# Patient Record
Sex: Male | Born: 1965
Health system: Southern US, Community
[De-identification: ages and names within clinical notes are randomized; demographics above are authoritative.]

## PROBLEM LIST (undated history)

## (undated) DIAGNOSIS — K219 Gastro-esophageal reflux disease without esophagitis: Secondary | ICD-10-CM

## (undated) DIAGNOSIS — E785 Hyperlipidemia, unspecified: Secondary | ICD-10-CM

## (undated) DIAGNOSIS — J302 Other seasonal allergic rhinitis: Secondary | ICD-10-CM

## (undated) DIAGNOSIS — M2550 Pain in unspecified joint: Secondary | ICD-10-CM

## (undated) DIAGNOSIS — M549 Dorsalgia, unspecified: Secondary | ICD-10-CM

## (undated) DIAGNOSIS — G4733 Obstructive sleep apnea (adult) (pediatric): Principal | ICD-10-CM

## (undated) DIAGNOSIS — G2581 Restless legs syndrome: Secondary | ICD-10-CM

## (undated) DIAGNOSIS — G4730 Sleep apnea, unspecified: Secondary | ICD-10-CM

## (undated) DIAGNOSIS — Z8619 Personal history of other infectious and parasitic diseases: Secondary | ICD-10-CM

## (undated) HISTORY — DX: Gastro-esophageal reflux disease without esophagitis: K21.9

## (undated) HISTORY — DX: Hyperlipidemia, unspecified: E78.5

## (undated) HISTORY — DX: Other seasonal allergic rhinitis: J30.2

## (undated) HISTORY — DX: Personal history of other infectious and parasitic diseases: Z86.19

## (undated) HISTORY — DX: Dorsalgia, unspecified: M54.9

## (undated) HISTORY — PX: ANKLE SURGERY: SHX546

## (undated) HISTORY — DX: Restless legs syndrome: G25.81

## (undated) HISTORY — DX: Sleep apnea, unspecified: G47.30

## (undated) HISTORY — DX: Pain in unspecified joint: M25.50

## (undated) HISTORY — PX: UMBILICAL HERNIA REPAIR: SHX196

## (undated) HISTORY — DX: Obstructive sleep apnea (adult) (pediatric): G47.33

---
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
## 2008-03-28 ENCOUNTER — Ambulatory Visit: Payer: Self-pay | Admitting: Family Medicine

## 2008-03-28 DIAGNOSIS — Z8619 Personal history of other infectious and parasitic diseases: Secondary | ICD-10-CM | POA: Insufficient documentation

## 2008-03-28 DIAGNOSIS — E1169 Type 2 diabetes mellitus with other specified complication: Secondary | ICD-10-CM | POA: Insufficient documentation

## 2008-03-28 DIAGNOSIS — E785 Hyperlipidemia, unspecified: Secondary | ICD-10-CM | POA: Insufficient documentation

## 2008-03-28 DIAGNOSIS — F17200 Nicotine dependence, unspecified, uncomplicated: Secondary | ICD-10-CM | POA: Insufficient documentation

## 2008-03-28 DIAGNOSIS — J309 Allergic rhinitis, unspecified: Secondary | ICD-10-CM | POA: Insufficient documentation

## 2008-03-30 LAB — CONVERTED CEMR LAB
ALT: 30 units/L (ref 0–53)
AST: 19 units/L (ref 0–37)
Albumin: 4 g/dL (ref 3.5–5.2)
Cholesterol: 272 mg/dL (ref 0–200)
Direct LDL: 239.3 mg/dL
Total Protein: 6.8 g/dL (ref 6.0–8.3)
Triglycerides: 112 mg/dL (ref 0–149)

## 2008-10-07 ENCOUNTER — Telehealth: Payer: Self-pay | Admitting: Family Medicine

## 2008-12-14 ENCOUNTER — Telehealth: Payer: Self-pay | Admitting: Family Medicine

## 2009-04-17 ENCOUNTER — Telehealth: Payer: Self-pay | Admitting: Family Medicine

## 2011-04-02 ENCOUNTER — Ambulatory Visit (INDEPENDENT_AMBULATORY_CARE_PROVIDER_SITE_OTHER): Payer: BC Managed Care – PPO | Admitting: Family Medicine

## 2011-04-02 ENCOUNTER — Encounter: Payer: Self-pay | Admitting: Family Medicine

## 2011-04-02 VITALS — BP 112/60 | HR 72 | Temp 98.4°F | Ht 73.0 in | Wt 230.8 lb

## 2011-04-02 DIAGNOSIS — F17200 Nicotine dependence, unspecified, uncomplicated: Secondary | ICD-10-CM

## 2011-04-02 DIAGNOSIS — E785 Hyperlipidemia, unspecified: Secondary | ICD-10-CM

## 2011-04-02 MED ORDER — ASPIRIN EC 81 MG PO TBEC: DELAYED_RELEASE_TABLET | ORAL | Status: DC

## 2011-04-02 NOTE — Assessment & Plan Note (Signed)
Discussed importance of smoking cessation, reviewed ways to quit.

## 2011-04-02 NOTE — Assessment & Plan Note (Signed)
Reviewed [place]ham risk - currently 23%.  If lowers tot chol <200, drops to 12%.  If quits smoking instead, drops to 8%.  Discussed this. Check blood work when returns fasting, then decide on lipitor. Given elevated risk, recommended start aspirin a few times a week.

## 2011-04-02 NOTE — Progress Notes (Signed)
Subjective:    Patient ID: Austin Hernandez, male    DOB: 1966-04-23, 45 y.o.   MRN: [id number]  HPI CC: new pt, establish  Previously saw Dr. Patsy Lager, hasn't been in recently (3 years).  No questions or concern today.  HLD - h/o HLD, was on lipitor.  Stopped taking.  Did bring levels down but thought was too expensive.  This was 4-5 years ago.  Smoker - 1 ppd x 25 yrs.  Tried patches - quit for 6 mo.  Doesn't want chantix (bad experience with friends).  Hasn't thought about wellbutrin in past.    Preventative: Last CPE - 2009. Unsure tetanus shot, thinks done here.  No paper chart available. Declines flu shot.  Medications and allergies reviewed and updated in chart.  Past histories reviewed and updated if relevant as below. Patient Active Problem List  Diagnoses  . HERPES SIMPLEX WITHOUT MENTION OF COMPLICATION  . HYPERLIPIDEMIA  . TOBACCO USE  . ALLERGIC RHINITIS   Past Medical History  Diagnosis Date  . Seasonal allergies   . HLD (hyperlipidemia)   . History of chicken pox    Past Surgical History  Procedure Date  . Adenoidectomy 1970  . Ankle surgery 1980s    torn ligaments; bilateral; separate surgeries (4 total), sports   History  Substance Use Topics  . Smoking status: Current Everyday Smoker -- 1.0 packs/day for 25 years    Types: Cigarettes  . Smokeless tobacco: Never Used  . Alcohol Use: Yes     Occasional (weekends)   Family History  Problem Relation Age of Onset  . Arthritis Mother   . Squamous cell carcinoma Father   . Cancer Father 65    throat cancer (squamous), smoker  . Stroke Maternal Grandmother 50  . Diabetes Maternal Grandmother   . Cancer Paternal Grandfather     lung?  . Coronary artery disease Neg Hx   . Hyperlipidemia Neg Hx   . Hypertension Neg Hx    No Known Allergies No current outpatient prescriptions on file prior to visit.   Review of Systems  Constitutional: Negative for fever, chills, activity change, appetite change,  fatigue and unexpected weight change.  HENT: Negative for hearing loss and neck pain.   Eyes: Negative for visual disturbance.  Respiratory: Negative for cough, chest tightness, shortness of breath and wheezing.   Cardiovascular: Negative for chest pain, palpitations and leg swelling.  Gastrointestinal: Negative for nausea, vomiting, abdominal pain, diarrhea, constipation, blood in stool and abdominal distention.  Genitourinary: Negative for hematuria and difficulty urinating.  Musculoskeletal: Negative for myalgias and arthralgias.  Skin: Negative for rash.  Neurological: Negative for dizziness, seizures, syncope and headaches.  Hematological: Does not bruise/bleed easily.  Psychiatric/Behavioral: Negative for dysphoric mood. The patient is not nervous/anxious.        Objective:   Physical Exam  Nursing note and vitals reviewed. Constitutional: He is oriented to person, place, and time. He appears well-developed and well-nourished. No distress.  HENT:  Head: Normocephalic and atraumatic.  Right Ear: External ear normal.  Left Ear: External ear normal.  Nose: Nose normal.  Mouth/Throat: Oropharynx is clear and moist.  Eyes: Conjunctivae and EOM are normal. Pupils are equal, round, and reactive to light.  Neck: Normal range of motion. Neck supple. No thyromegaly present.  Cardiovascular: Normal rate, regular rhythm, normal heart sounds and intact distal pulses.   No murmur heard. Pulses:      Radial pulses are 2+ on the right side, and 2+ on the  left side.  Pulmonary/Chest: Effort normal and breath sounds normal. No respiratory distress. He has no wheezes. He has no rales.  Abdominal: Soft. Bowel sounds are normal. He exhibits no distension and no mass. There is no tenderness. There is no rebound and no guarding.  Musculoskeletal: Normal range of motion.  Lymphadenopathy:    He has no cervical adenopathy.  Neurological: He is alert and oriented to person, place, and time.       CN  grossly intact, station and gait intact  Skin: Skin is warm and dry. No rash noted.  Psychiatric: He has a normal mood and affect. His behavior is normal. Judgment and thought content normal.          Assessment & Plan:

## 2011-04-02 NOTE — Patient Instructions (Signed)
Return at your convenience fasting for blood work. We will discuss starting lipitor (generic is atorvastatin) when we get blood work results. Look into [email or web address]. I'd recommend starting baby aspirin (81mg ) a few times a week. Good to meet you today call us with questions.

## 2011-04-04 ENCOUNTER — Other Ambulatory Visit: Payer: BC Managed Care – PPO

## 2011-04-05 ENCOUNTER — Other Ambulatory Visit: Payer: BC Managed Care – PPO

## 2011-04-29 ENCOUNTER — Other Ambulatory Visit: Payer: Self-pay | Admitting: Family Medicine

## 2011-08-28 ENCOUNTER — Other Ambulatory Visit: Payer: Self-pay | Admitting: Family Medicine

## 2011-09-25 ENCOUNTER — Other Ambulatory Visit: Payer: Self-pay | Admitting: Family Medicine

## 2012-03-09 ENCOUNTER — Other Ambulatory Visit: Payer: Self-pay | Admitting: Family Medicine

## 2013-01-06 ENCOUNTER — Other Ambulatory Visit: Payer: Self-pay | Admitting: Family Medicine

## 2013-01-06 NOTE — Telephone Encounter (Signed)
Ok to refill? Has not been seen in almost 2 years.  

## 2013-01-13 ENCOUNTER — Other Ambulatory Visit: Payer: Self-pay | Admitting: Family Medicine

## 2013-01-13 NOTE — Telephone Encounter (Signed)
Ok to refill? Has not been seen in almost 2 years.

## 2013-04-19 ENCOUNTER — Other Ambulatory Visit: Payer: Self-pay | Admitting: Family Medicine

## 2013-07-03 ENCOUNTER — Other Ambulatory Visit: Payer: Self-pay | Admitting: Family Medicine

## 2013-08-09 ENCOUNTER — Other Ambulatory Visit: Payer: Self-pay | Admitting: Family Medicine

## 2013-08-09 NOTE — Telephone Encounter (Signed)
Ok to refill? Has not been seen since 2012.

## 2015-10-18 ENCOUNTER — Other Ambulatory Visit: Payer: Self-pay | Admitting: Family Medicine

## 2016-09-19 ENCOUNTER — Encounter (INDEPENDENT_AMBULATORY_CARE_PROVIDER_SITE_OTHER): Payer: Self-pay | Admitting: Family Medicine

## 2016-10-29 ENCOUNTER — Ambulatory Visit (INDEPENDENT_AMBULATORY_CARE_PROVIDER_SITE_OTHER): Payer: Self-pay | Admitting: Family Medicine

## 2016-11-07 ENCOUNTER — Ambulatory Visit (INDEPENDENT_AMBULATORY_CARE_PROVIDER_SITE_OTHER): Payer: Self-pay | Admitting: Family Medicine

## 2016-11-11 ENCOUNTER — Encounter (INDEPENDENT_AMBULATORY_CARE_PROVIDER_SITE_OTHER): Payer: Self-pay

## 2016-11-11 ENCOUNTER — Encounter (INDEPENDENT_AMBULATORY_CARE_PROVIDER_SITE_OTHER): Payer: Self-pay | Admitting: Family Medicine

## 2016-11-11 ENCOUNTER — Ambulatory Visit (INDEPENDENT_AMBULATORY_CARE_PROVIDER_SITE_OTHER): Payer: BLUE CROSS/BLUE SHIELD | Admitting: Family Medicine

## 2016-11-11 VITALS — BP 142/84 | HR 85 | Temp 97.7°F | Ht 73.0 in | Wt 297.0 lb

## 2016-11-11 DIAGNOSIS — R030 Elevated blood-pressure reading, without diagnosis of hypertension: Secondary | ICD-10-CM

## 2016-11-11 DIAGNOSIS — E669 Obesity, unspecified: Secondary | ICD-10-CM

## 2016-11-11 DIAGNOSIS — Z1389 Encounter for screening for other disorder: Secondary | ICD-10-CM | POA: Diagnosis not present

## 2016-11-11 DIAGNOSIS — Z9189 Other specified personal risk factors, not elsewhere classified: Secondary | ICD-10-CM | POA: Diagnosis not present

## 2016-11-11 DIAGNOSIS — R5383 Other fatigue: Secondary | ICD-10-CM | POA: Diagnosis not present

## 2016-11-11 DIAGNOSIS — Z0289 Encounter for other administrative examinations: Secondary | ICD-10-CM

## 2016-11-11 DIAGNOSIS — Z6839 Body mass index (BMI) 39.0-39.9, adult: Secondary | ICD-10-CM

## 2016-11-11 DIAGNOSIS — R0602 Shortness of breath: Secondary | ICD-10-CM

## 2016-11-11 DIAGNOSIS — R0683 Snoring: Secondary | ICD-10-CM | POA: Diagnosis not present

## 2016-11-11 DIAGNOSIS — Z1331 Encounter for screening for depression: Secondary | ICD-10-CM

## 2016-11-11 NOTE — Progress Notes (Signed)
Office: [phone]  /  Fax: [phone]   HPI:   Chief Complaint: OBESITY  Austin Hernandez (MR# [id number]) is a 51 y.o. male who presents on 11/11/2016 for obesity evaluation and treatment. Current BMI is Body mass index is 39.18 kg/m.Austin Hernandez has struggled with obesity for years and has been unsuccessful in either losing weight or maintaining long term weight loss. Austin Hernandez attended our information session and states Austin Hernandez is currently in the action stage of change and ready to dedicate time achieving and maintaining a healthier weight.  Austin Hernandez states his family eats meals together Austin Hernandez thinks his family will eat healthier with  him his desired weight loss is 72 lbs Austin Hernandez has been heavy most of  his life Austin Hernandez started gaining weight in 30's after marriage his heaviest weight ever was 302 lbs. Austin Hernandez has significant food cravings issues  Austin Hernandez snacks frequently in the evenings Austin Hernandez is frequently drinking liquids with calories Austin Hernandez frequently makes poor food choices Austin Hernandez frequently eats larger portions than normal  Austin Hernandez has binge eating behaviors   Fatigue Austin Hernandez feels his energy is lower than it should be. This has worsened with weight gain and has not worsened recently. Austin Hernandez admits to daytime somnolence and  denies waking up still tired. Patient is at risk for obstructive sleep apnea. Austin Hernandez has a history of symptoms of daytime fatigue. Patient generally gets 6 or 7 hours of sleep per night, and states they generally have restful sleep. Snoring is present. Apneic episodes are present. Epworth Sleepiness Score is 18  Dyspnea on exertion Austin Hernandez notes increasing shortness of breath with exercising and seems to be worsening over time with weight gain. Austin Hernandez notes getting out of breath sooner with activity than Austin Hernandez used to. This has not gotten worse recently. Austin Hernandez denies orthopnea.  Elevated Blood Pressure without history of hypertension Foch denies history of elevated blood pressure but is elevated  today. Austin Hernandez denies palpitations or chest pain. Starlin has a sleep history suggestive of sleep apnea.  Snoring Austin Hernandez has snoring with witnessed apnea and sometimes wakes himself gasping for air. Austin Hernandez denies excessive day somnolence and has a moderate chance of falling asleep while in traffic.  At risk for cardiovascular disease Austin Hernandez is at a higher than average risk for cardiovascular disease due to obesity. Austin Hernandez currently denies any chest pain.  Depression Screen Austin Hernandez's Food and Mood (modified PHQ-9) score was  Depression screen PHQ 2/9 11/11/2016  Decreased Interest 1  Down, Depressed, Hopeless 0  PHQ - 2 Score 1  Altered sleeping 3  Tired, decreased energy 3  Change in appetite 0  Feeling bad or failure about yourself  0  Trouble concentrating 0  Moving slowly or fidgety/restless 1  Suicidal thoughts 0  PHQ-9 Score 8    ALLERGIES: No Known Allergies  MEDICATIONS: Current Outpatient Prescriptions on File Prior to Visit  Medication Sig Dispense Refill  . aspirin EC 81 MG tablet A few times a week.    . valACYclovir (VALTREX) 500 MG tablet TAKE 1 TABLET BY MOUTH TWICE DAILY FOR 3 DAYS 18 tablet 1   No current facility-administered medications on file prior to visit.     PAST MEDICAL HISTORY: Past Medical History:  Diagnosis Date  . Back pain   . GERD (gastroesophageal reflux disease)   . History of chicken pox   . HLD (hyperlipidemia)   . Joint pain   . Restless leg   . Seasonal allergies   . Sleep apnea     PAST SURGICAL  HISTORY: Past Surgical History:  Procedure Laterality Date  . ADENOIDECTOMY  1970  . ANKLE SURGERY  1980s   torn ligaments; bilateral; separate surgeries (4 total), sports    SOCIAL HISTORY: Social History  Substance Use Topics  . Smoking status: Former Smoker    Packs/day: 1.00    Years: 25.00    Types: Cigarettes  . Smokeless tobacco: Never Used  . Alcohol use Yes     Comment: Occasional (weekends)    FAMILY HISTORY: Family  History  Problem Relation Age of Onset  . Arthritis Mother   . Hypertension Mother   . Hyperlipidemia Mother   . Squamous cell carcinoma Father   . Cancer Father 56       throat cancer (squamous), smoker  . Stroke Maternal Grandmother 50  . Diabetes Maternal Grandmother   . Cancer Paternal Grandfather        lung?  . Coronary artery disease Neg Hx     ROS: Review of Systems  Constitutional: Positive for malaise/fatigue.       Lumps Right Breast  HENT: Positive for congestion (Nasal Stuffiness).   Eyes:       Wear Glasses or Contacts  Respiratory: Positive for cough, shortness of breath (on exertion) and wheezing.        Snoring  Cardiovascular: Negative for chest pain and palpitations.       Leg Cramping  Gastrointestinal: Positive for heartburn.  Musculoskeletal: Positive for back pain and joint pain.  Endo/Heme/Allergies:       Hay Fever  Psychiatric/Behavioral: Positive for depression.    PHYSICAL EXAM: Blood pressure (!) 142/84, pulse 85, temperature 97.7 F (36.5 C), temperature source Oral, height 6\' 1"  (1.854 m), weight 297 lb (134.7 kg), SpO2 95 %. Body mass index is 39.18 kg/m. Physical Exam  Constitutional: Austin Hernandez is oriented to person, place, and time. Austin Hernandez appears well-developed and well-nourished.  Cardiovascular: Normal rate.   Pulmonary/Chest: Effort normal.  Musculoskeletal: Normal range of motion.  Neurological: Austin Hernandez is oriented to person, place, and time.  Skin: Skin is warm and dry.  Psychiatric: Austin Hernandez has a normal mood and affect. His behavior is normal.  Vitals reviewed.   RECENT LABS AND TESTS: BMET No results found for: NA, K, CL, CO2, GLUCOSE, BUN, CREATININE, CALCIUM, GFRNONAA, GFRAA No results found for: HGBA1C No results found for: INSULIN CBC No results found for: WBC, RBC, HGB, HCT, PLT, MCV, MCH, MCHC, RDW, LYMPHSABS, MONOABS, EOSABS, BASOSABS Iron/TIBC/Ferritin/ %Sat No results found for: IRON, TIBC, FERRITIN, IRONPCTSAT Lipid Panel       Component Value Date/Time   CHOL 272 (HH) 03/28/2008 1101   TRIG 112 03/28/2008 1101   HDL 32.7 (L) 03/28/2008 1101   CHOLHDL 8.3 CALC 03/28/2008 1101   VLDL 22 03/28/2008 1101   LDLDIRECT 239.3 03/28/2008 1101   Hepatic Function Panel     Component Value Date/Time   PROT 6.8 03/28/2008 1101   ALBUMIN 4.0 03/28/2008 1101   AST 19 03/28/2008 1101   ALT 30 03/28/2008 1101   ALKPHOS 54 03/28/2008 1101   BILITOT 0.7 03/28/2008 1101   BILIDIR 0.1 03/28/2008 1101   No results found for: TSH  ECG  shows NSR with a rate of 81 BPM INDIRECT CALORIMETER done today shows a VO2 of 291 and a REE of 2026.    ASSESSMENT AND PLAN: Other fatigue - Plan: EKG 12-Lead, Vitamin B12, CBC With Differential, Comprehensive metabolic panel, Folate, Hemoglobin A1c, Insulin, random, Lipid Panel With LDL/HDL Ratio, T3, T4, free,  TSH, VITAMIN D 25 Hydroxy (Vit-D Deficiency, Fractures)  Shortness of breath on exertion  Blood pressure elevated without history of HTN  Snoring  Depression screening  Class 2 obesity without serious comorbidity with body mass index (BMI) of 39.0 to 39.9 in adult, unspecified obesity type  PLAN:  Fatigue Austin Hernandez was informed that his fatigue may be related to obesity, depression or many other causes. Labs will be ordered, and in the meanwhile Austin Hernandez has agreed to work on diet, exercise and weight loss to help with fatigue. Proper sleep hygiene was discussed including the need for 7-8 hours of quality sleep each night. A sleep study was ordered based on symptoms and Epworth score. Appointment scheduled with [facility] on May 17th at 10:30am.  Dyspnea on exertion Austin Hernandez's shortness of breath appears to be obesity related and exercise induced. Austin Hernandez has agreed to work on weight loss and gradually increase exercise to treat his exercise induced shortness of breath. If Austin Hernandez follows our instructions and loses weight without improvement of his shortness of breath, we  will plan to refer to pulmonology. We will monitor this condition regularly. Austin Hernandez agrees to this plan.  Elevated Blood Pressure without history of hypertension We will check labs and Austin Hernandez agrees to work on diet, exercise and weight loss and we will re-check blood pressure in 2 weeks. Austin Hernandez agrees to follow up with our clinic in 2 weeks.  Snoring Sleep study options were discussed with Austin Hernandez today. Austin Hernandez agrees to work on diet, exercise and weight loss in the meanwhile and follow up with our clinic in 2 weeks. We will order sleep study and will follow closely. Appointment scheduled with [facility] on May 17th at 10:30am.  Depression Screen Austin Hernandez had a moderately positive depression screening. Depression is commonly associated with obesity and often results in emotional eating behaviors. We will monitor this closely and work on CBT to help improve the non-hunger eating patterns. Referral to Psychology may be required if no improvement is seen as Austin Hernandez continues in our clinic.  Cardiovascular risk counselling Austin Hernandez was given extended (at least 15 minutes) coronary artery disease prevention counseling today. Austin Hernandez is 51 y.o. male and has risk factors for heart disease including obesity. We discussed intensive lifestyle modifications today with an emphasis on specific weight loss instructions and strategies. Austin Hernandez was also informed of the importance of increasing exercise and decreasing saturated fats to help prevent heart disease.  Obesity Austin Hernandez is currently in the action stage of change and his goal is to continue with weight loss efforts Austin Hernandez has agreed to follow the Category 3 plan +100 calories Austin Hernandez has been instructed to work up to a goal of 150 minutes of combined cardio and strengthening exercise per week for weight loss and overall health benefits. We discussed the following Behavioral Modification Strategies today: increasing lean protein intake, no skipping meals and work on  meal planning and easy cooking plans  Austin Hernandez has agreed to follow up with our clinic in 2 weeks. Austin Hernandez was informed of the importance of frequent follow up visits to maximize his success with intensive lifestyle modifications for his multiple health conditions. Austin Hernandez was informed we would discuss his lab results at his next visit unless there is a critical issue that needs to be addressed sooner. Kastiel agreed to keep his next visit at the agreed upon time to discuss these results.  Austin Hernandez, Nevada Crane, am acting as scribe for Quillian Quince, MD  Austin Hernandez have reviewed the above documentation for accuracy and  completeness, and Austin Hernandez agree with the above. -Quillian Quincearen Beasley, MD

## 2016-11-12 LAB — CBC WITH DIFFERENTIAL
Basophils Absolute: 0 10*3/uL (ref 0.0–0.2)
Basos: 0 %
EOS (ABSOLUTE): 0.1 10*3/uL (ref 0.0–0.4)
EOS: 2 %
HEMOGLOBIN: 15 g/dL (ref 13.0–17.7)
Hematocrit: 43.6 % (ref 37.5–51.0)
Immature Grans (Abs): 0 10*3/uL (ref 0.0–0.1)
Immature Granulocytes: 0 %
Lymphocytes Absolute: 2.3 10*3/uL (ref 0.7–3.1)
Lymphs: 31 %
MCH: 31.6 pg (ref 26.6–33.0)
MCHC: 34.4 g/dL (ref 31.5–35.7)
MCV: 92 fL (ref 79–97)
MONOCYTES: 10 %
Monocytes Absolute: 0.7 10*3/uL (ref 0.1–0.9)
NEUTROS ABS: 4.2 10*3/uL (ref 1.4–7.0)
Neutrophils: 57 %
RBC: 4.75 x10E6/uL (ref 4.14–5.80)
RDW: 14.4 % (ref 12.3–15.4)
WBC: 7.4 10*3/uL (ref 3.4–10.8)

## 2016-11-12 LAB — COMPREHENSIVE METABOLIC PANEL
ALK PHOS: 76 IU/L (ref 39–117)
ALT: 92 IU/L — ABNORMAL HIGH (ref 0–44)
AST: 34 IU/L (ref 0–40)
Albumin/Globulin Ratio: 1.6 (ref 1.2–2.2)
Albumin: 4 g/dL (ref 3.5–5.5)
BILIRUBIN TOTAL: 0.4 mg/dL (ref 0.0–1.2)
BUN/Creatinine Ratio: 15 (ref 9–20)
BUN: 13 mg/dL (ref 6–24)
CALCIUM: 8.8 mg/dL (ref 8.7–10.2)
CO2: 23 mmol/L (ref 18–29)
Chloride: 102 mmol/L (ref 96–106)
Creatinine, Ser: 0.86 mg/dL (ref 0.76–1.27)
GFR calc Af Amer: 115 mL/min/{1.73_m2} (ref >59)
GFR calc non Af Amer: 100 mL/min/{1.73_m2} (ref >59)
Globulin, Total: 2.5 g/dL (ref 1.5–4.5)
Glucose: 109 mg/dL — ABNORMAL HIGH (ref 65–99)
Potassium: 4.1 mmol/L (ref 3.5–5.2)
SODIUM: 142 mmol/L (ref 134–144)
TOTAL PROTEIN: 6.5 g/dL (ref 6.0–8.5)

## 2016-11-12 LAB — T3: T3 TOTAL: 127 ng/dL (ref 71–180)

## 2016-11-12 LAB — HEMOGLOBIN A1C
Est. average glucose Bld gHb Est-mCnc: 137 mg/dL
HEMOGLOBIN A1C: 6.4 % — AB (ref 4.8–5.6)

## 2016-11-12 LAB — LIPID PANEL WITH LDL/HDL RATIO
Cholesterol, Total: 250 mg/dL — ABNORMAL HIGH (ref 100–199)
HDL: 38 mg/dL — ABNORMAL LOW (ref >39)
LDL Calculated: 181 mg/dL — ABNORMAL HIGH (ref 0–99)
LDL/HDL RATIO: 4.8 ratio — AB (ref 0.0–3.6)
Triglycerides: 156 mg/dL — ABNORMAL HIGH (ref 0–149)
VLDL CHOLESTEROL CAL: 31 mg/dL (ref 5–40)

## 2016-11-12 LAB — FOLATE: FOLATE: 8.6 ng/mL (ref >3.0)

## 2016-11-12 LAB — VITAMIN D 25 HYDROXY (VIT D DEFICIENCY, FRACTURES): Vit D, 25-Hydroxy: 17.6 ng/mL — ABNORMAL LOW (ref 30.0–100.0)

## 2016-11-12 LAB — TSH: TSH: 3.26 u[IU]/mL (ref 0.450–4.500)

## 2016-11-12 LAB — VITAMIN B12: Vitamin B-12: 487 pg/mL (ref 232–1245)

## 2016-11-12 LAB — INSULIN, RANDOM: INSULIN: 28.8 u[IU]/mL — ABNORMAL HIGH (ref 2.6–24.9)

## 2016-11-12 LAB — T4, FREE: FREE T4: 0.93 ng/dL (ref 0.82–1.77)

## 2016-11-14 ENCOUNTER — Encounter: Payer: Self-pay | Admitting: Pulmonary Disease

## 2016-11-14 ENCOUNTER — Ambulatory Visit (INDEPENDENT_AMBULATORY_CARE_PROVIDER_SITE_OTHER): Payer: BLUE CROSS/BLUE SHIELD | Admitting: Pulmonary Disease

## 2016-11-14 VITALS — BP 124/82 | HR 84 | Ht 73.0 in | Wt 298.6 lb

## 2016-11-14 DIAGNOSIS — R29818 Other symptoms and signs involving the nervous system: Secondary | ICD-10-CM | POA: Diagnosis not present

## 2016-11-14 DIAGNOSIS — R0683 Snoring: Secondary | ICD-10-CM | POA: Diagnosis not present

## 2016-11-14 NOTE — Patient Instructions (Signed)
Will arrange for home sleep study Will call to arrange for follow up after sleep study reviewed  

## 2016-11-14 NOTE — Progress Notes (Signed)
   Subjective:    Patient ID: Jairo Benarsten Upshur, male    DOB: 04/04/1966, 51 y.o.   MRN: [id number]  HPI    Review of Systems  Constitutional: Negative for fever and unexpected weight change.  HENT: Negative for congestion, dental problem, ear pain, nosebleeds, postnasal drip, rhinorrhea, sinus pressure, sneezing, sore throat and trouble swallowing.   Eyes: Negative for redness and itching.  Respiratory: Negative for cough, chest tightness, shortness of breath and wheezing.   Cardiovascular: Negative for palpitations and leg swelling.  Gastrointestinal: Negative for nausea and vomiting.  Genitourinary: Negative for dysuria.  Musculoskeletal: Negative for joint swelling.  Skin: Negative for rash.  Neurological: Negative for headaches.  Hematological: Does not bruise/bleed easily.  Psychiatric/Behavioral: Negative for dysphoric mood. The patient is not nervous/anxious.        Objective:   Physical Exam        Assessment & Plan:

## 2016-11-14 NOTE — Progress Notes (Signed)
Past Surgical History He  has a past surgical history that includes Adenoidectomy (1970) and Ankle surgery (1980s).  No Known Allergies  Family History His family history includes Arthritis in his mother; Cancer in his paternal grandfather; Cancer (age of onset: 34) in his father; Diabetes in his maternal grandmother; Hyperlipidemia in his mother; Hypertension in his mother; Squamous cell carcinoma in his father; Stroke (age of onset: 62) in his maternal grandmother.  Social History He  reports that he quit smoking about 6 years ago. His smoking use included Cigarettes. He has a 25.00 pack-year smoking history. He has never used smokeless tobacco. He reports that he drinks about 3.0 - 3.6 oz of alcohol per week . He reports that he does not use drugs.  Review of systems Constitutional: Negative for fever and unexpected weight change.  HENT: Negative for congestion, dental problem, ear pain, nosebleeds, postnasal drip, rhinorrhea, sinus pressure, sneezing, sore throat and trouble swallowing.   Eyes: Negative for redness and itching.  Respiratory: Negative for cough, chest tightness, shortness of breath and wheezing.   Cardiovascular: Negative for palpitations and leg swelling.  Gastrointestinal: Negative for nausea and vomiting.  Genitourinary: Negative for dysuria.  Musculoskeletal: Negative for joint swelling.  Skin: Negative for rash.  Neurological: Negative for headaches.  Hematological: Does not bruise/bleed easily.  Psychiatric/Behavioral: Negative for dysphoric mood. The patient is not nervous/anxious.     No current outpatient prescriptions on file prior to visit.   No current facility-administered medications on file prior to visit.     Chief Complaint  Patient presents with  . SLEEP CONSULT    Referred by Dr Dalbert Garnet for snoring and RLS. Epworth Score: 15    Past medical history He  has a past medical history of Back pain; GERD (gastroesophageal reflux disease); History  of chicken pox; HLD (hyperlipidemia); Joint pain; Restless leg; Seasonal allergies; and Sleep apnea.  Vital signs BP 124/82 (BP Location: Left Arm, Cuff Size: Normal)   Pulse 84   Ht 6\' 1"  (1.854 m)   Wt 298 lb 9.6 oz (135.4 kg)   SpO2 97%   BMI 39.40 kg/m   History of Present Illness Austin Hernandez is a 51 y.o. male for evaluation of sleep problems.  His wife has been concerned about his snoring, and says he stops breathing while asleep.  He will wake up hearing himself snort.  He stopped smoking several years ago and gained about 50 lbs after this.  He noticed his sleep issues getting worse with weight gain.  He will fall asleep at times while watching a movie.  He goes to sleep between 9 and 11 pm.  He falls asleep after 10 minutes.  He wakes up 1 time to use the bathroom.  He gets out of bed at 630 am.  He feels okay in the morning.  He gets an extra hour of sleep on weekends.  He denies morning headache.  He does not use anything to help him fall sleep.  He drinks 2 cups of coffee in the morning.  He gets funny feeling in his legs about 1 or 2 times per month.  He feels like there is an electric feeling in his legs.  This happens around the time he is going to bed. It can last up to an hour.  He will take tylenol and this helps.  He denies sleep walking, sleep talking, bruxism, or nightmares.  He denies sleep hallucinations, sleep paralysis, or cataplexy.  The Epworth score is 15  out of 24.   Physical Exam:  General - No distress Eyes - wears glasses, pupils reactive ENT - No sinus tenderness, no oral exudate, no LAN, no thyromegaly, TM clear, pupils equal/reactive, MP 4, enlarged tongue Cardiac - s1s2 regular, no murmur, pulses symmetric Chest - No wheeze/rales/dullness, good air entry, normal respiratory excursion Back - No focal tenderness Abd - Soft, non-tender, no organomegaly, + bowel sounds Ext - No edema Neuro - Normal strength, cranial nerves intact Skin - No  rashes Psych - Normal mood, and behavior  Discussion: He has snoring, sleep disruption, witnessed apnea, and daytime sleepiness.  His symptoms have progressed after he gained weight.  His BMI is > 35.  I am concerned he could have sleep apnea.  We discussed how sleep apnea can affect various health problems, including risks for hypertension, cardiovascular disease, and diabetes.  We also discussed how sleep disruption can increase risks for accidents, such as while driving.  Weight loss as a means of improving sleep apnea was also reviewed.  Additional treatment options discussed were CPAP therapy, oral appliance, and surgical intervention.  Assessment/plan:  Snoring with concern for obstructive sleep apnea. - will arrange for home sleep study to further assess   Patient Instructions  Will arrange for home sleep study Will call to arrange for follow up after sleep study reviewed     Coralyn HellingVineet Oaklynn Stierwalt, M.D. Pager [phone] 11/14/2016, 11:12 AM

## 2016-11-26 ENCOUNTER — Ambulatory Visit (INDEPENDENT_AMBULATORY_CARE_PROVIDER_SITE_OTHER): Payer: BLUE CROSS/BLUE SHIELD | Admitting: Family Medicine

## 2016-11-26 VITALS — BP 128/85 | HR 81 | Temp 98.4°F | Ht 73.0 in | Wt 292.0 lb

## 2016-11-26 DIAGNOSIS — E7849 Other hyperlipidemia: Secondary | ICD-10-CM

## 2016-11-26 DIAGNOSIS — Z9189 Other specified personal risk factors, not elsewhere classified: Secondary | ICD-10-CM | POA: Diagnosis not present

## 2016-11-26 DIAGNOSIS — R7303 Prediabetes: Secondary | ICD-10-CM

## 2016-11-26 DIAGNOSIS — Z6838 Body mass index (BMI) 38.0-38.9, adult: Secondary | ICD-10-CM | POA: Diagnosis not present

## 2016-11-26 DIAGNOSIS — R7989 Other specified abnormal findings of blood chemistry: Secondary | ICD-10-CM

## 2016-11-26 DIAGNOSIS — E669 Obesity, unspecified: Secondary | ICD-10-CM | POA: Diagnosis not present

## 2016-11-26 DIAGNOSIS — E784 Other hyperlipidemia: Secondary | ICD-10-CM

## 2016-11-26 DIAGNOSIS — E559 Vitamin D deficiency, unspecified: Secondary | ICD-10-CM | POA: Diagnosis not present

## 2016-11-26 DIAGNOSIS — R945 Abnormal results of liver function studies: Secondary | ICD-10-CM

## 2016-11-26 MED ORDER — METFORMIN HCL 500 MG PO TABS: 500.0000 mg | ORAL_TABLET | Freq: Every day | ORAL | 0 refills | Status: DC

## 2016-11-26 MED ORDER — VITAMIN D (ERGOCALCIFEROL) 1.25 MG (50000 UNIT) PO CAPS: 50000.0000 [IU] | ORAL_CAPSULE | ORAL | 0 refills | Status: DC

## 2016-11-26 NOTE — Progress Notes (Signed)
Office: [phone]  /  Fax: [phone]   HPI:   Chief Complaint: OBESITY Che is here to discuss his progress with his obesity treatment plan. He is on the  follow the Category 3 plan +100 calories and is following his eating plan approximately 90 % of the time. He states he is exercising 0 minutes 0 times per week. Chett has done well with weight loss on category 3 plan but deviated some from the plan and is getting bored especially with dinner. His weight is 292 lb (132.5 kg) today and has had a weight loss of 5 pounds over a period of 2 weeks since his last visit. He has lost 5 lbs since starting treatment with [place].  Vitamin D deficiency Mickael has a new diagnosis of vitamin D deficiency. He is not currently taking OTC vit D. He admits fatigue and denies nausea, vomiting or muscle weakness.  Elevated Liver Function Test Jadd has a new dx of elevated ALT.  He denies abdominal pain or jaundice and has never been told of any liver problems in the past. He denies excessive alcohol intake. Likely due to Non Alcohol Fatty Liver Disease.  Hyperlipidemia Travaris has hyperlipidemia, elevated LDL and triglycerides. Low HDL, not on statin. Agastya would like to try to control with diet. He has been trying to improve his cholesterol levels with intensive lifestyle modification including a low saturated fat diet, exercise and weight loss. He denies any chest pain, claudication or myalgias.  Pre-Diabetes Husain has a new diagnosis of pre-diabetes, very close to diabetes based on his elevated Hgb A1c of 6.4 and increased fasting glucose and insulin. He was informed this puts him at greater risk of developing diabetes. He is not taking metformin currently and continues to work on diet and exercise to decrease risk of diabetes. He admits polyphagia and denies nausea or hypoglycemia.  At risk for diabetes Deval is at higher than average risk for developing diabetes due to his obesity and  pre-diabetes. He currently denies polyuria or polydipsia.   ALLERGIES: No Known Allergies  MEDICATIONS: No current outpatient prescriptions on file prior to visit.   No current facility-administered medications on file prior to visit.     PAST MEDICAL HISTORY: Past Medical History:  Diagnosis Date  . Back pain   . GERD (gastroesophageal reflux disease)   . History of chicken pox   . HLD (hyperlipidemia)   . Joint pain   . Restless leg   . Seasonal allergies   . Sleep apnea     PAST SURGICAL HISTORY: Past Surgical History:  Procedure Laterality Date  . ADENOIDECTOMY  1970  . ANKLE SURGERY  1980s   torn ligaments; bilateral; separate surgeries (4 total), sports    SOCIAL HISTORY: Social History  Substance Use Topics  . Smoking status: Former Smoker    Packs/day: 1.00    Years: 25.00    Types: Cigarettes    Quit date: 07/01/2010  . Smokeless tobacco: Never Used  . Alcohol use 3.0 - 3.6 oz/week    5 - 6 Glasses of wine per week     Comment: Occasional (weekends)    FAMILY HISTORY: Family History  Problem Relation Age of Onset  . Arthritis Mother   . Hypertension Mother   . Hyperlipidemia Mother   . Squamous cell carcinoma Father   . Cancer Father 46       throat cancer (squamous), smoker  . Stroke Maternal Grandmother 50  . Diabetes Maternal Grandmother   .  Cancer Paternal Grandfather        lung?  . Coronary artery disease Neg Hx     ROS: Review of Systems  Constitutional: Positive for malaise/fatigue and weight loss.  Cardiovascular: Negative for chest pain and claudication.  Gastrointestinal: Negative for abdominal pain, nausea and vomiting.       Negative jaundice  Genitourinary: Negative for frequency.  Musculoskeletal: Negative for myalgias.       Negative muscle weakness  Endo/Heme/Allergies: Negative for polydipsia.       Polyphagia Negative hypoglycemia    PHYSICAL EXAM: Blood pressure 128/85, pulse 81, temperature 98.4 F (36.9 C),  temperature source Oral, height 6\' 1"  (1.854 m), weight 292 lb (132.5 kg), SpO2 95 %. Body mass index is 38.52 kg/m. Physical Exam  Constitutional: He is oriented to person, place, and time. He appears well-developed and well-nourished.  Cardiovascular: Normal rate.   Musculoskeletal: Normal range of motion.  Neurological: He is oriented to person, place, and time.  Skin: Skin is warm and dry.  Psychiatric: He has a normal mood and affect. His behavior is normal.  Vitals reviewed.   RECENT LABS AND TESTS: BMET    Component Value Date/Time   NA 142 11/11/2016 0954   K 4.1 11/11/2016 0954   CL 102 11/11/2016 0954   CO2 23 11/11/2016 0954   GLUCOSE 109 (H) 11/11/2016 0954   BUN 13 11/11/2016 0954   CREATININE 0.86 11/11/2016 0954   CALCIUM 8.8 11/11/2016 0954   GFRNONAA 100 11/11/2016 0954   GFRAA 115 11/11/2016 0954   Lab Results  Component Value Date   HGBA1C 6.4 (H) 11/11/2016   Lab Results  Component Value Date   INSULIN 28.8 (H) 11/11/2016   CBC    Component Value Date/Time   WBC 7.4 11/11/2016 0954   RBC 4.75 11/11/2016 0954   HCT 43.6 11/11/2016 0954   MCV 92 11/11/2016 0954   MCH 31.6 11/11/2016 0954   MCHC 34.4 11/11/2016 0954   RDW 14.4 11/11/2016 0954   LYMPHSABS 2.3 11/11/2016 0954   EOSABS 0.1 11/11/2016 0954   BASOSABS 0.0 11/11/2016 0954   Iron/TIBC/Ferritin/ %Sat No results found for: IRON, TIBC, FERRITIN, IRONPCTSAT Lipid Panel     Component Value Date/Time   CHOL 250 (H) 11/11/2016 0954   TRIG 156 (H) 11/11/2016 0954   HDL 38 (L) 11/11/2016 0954   CHOLHDL 8.3 CALC 03/28/2008 1101   VLDL 22 03/28/2008 1101   LDLCALC 181 (H) 11/11/2016 0954   LDLDIRECT 239.3 03/28/2008 1101   Hepatic Function Panel     Component Value Date/Time   PROT 6.5 11/11/2016 0954   ALBUMIN 4.0 11/11/2016 0954   AST 34 11/11/2016 0954   ALT 92 (H) 11/11/2016 0954   ALKPHOS 76 11/11/2016 0954   BILITOT 0.4 11/11/2016 0954   BILIDIR 0.1 03/28/2008 1101        Component Value Date/Time   TSH 3.260 11/11/2016 0954    ASSESSMENT AND PLAN: Vitamin D deficiency - Plan: Vitamin D, Ergocalciferol, (DRISDOL) 50000 units CAPS capsule  Prediabetes - Plan: metFORMIN (GLUCOPHAGE) 500 MG tablet  Elevated liver function tests  Other hyperlipidemia  At risk for diabetes mellitus  Class 2 obesity without serious comorbidity with body mass index (BMI) of 38.0 to 38.9 in adult, unspecified obesity type  PLAN:  Vitamin D Deficiency Savalas was informed that low vitamin D levels contributes to fatigue and are associated with obesity, breast, and colon cancer. He agrees to continue to take prescription Vit D @50 ,000  IU every week #4 with no refills and will follow up for routine testing of vitamin D, at least 2-3 times per year. He was informed of the risk of over-replacement of vitamin D and agrees to not increase his dose unless he discusses this with [place] first. Jordie agrees to follow up with our clinic in 2 weeks.  Elevated Liver Function Test We discussed the likely diagnosis of non alcoholic fatty liver disease today and how this condition is obesity related. Philipe was educated on his risk of developing NASH or even liver failure and the only proven treatment for NAFLD was weight loss. Sumedh agreed to continue with his weight loss efforts with healthier diet and exercise as an essential part of his treatment plan. We will re-check labs in 3 months and may need to do further workup if no improvement with weight loss.  Hyperlipidemia Kapono was informed of the American Heart Association Guidelines emphasizing intensive lifestyle modifications as the first line treatment for hyperlipidemia. We discussed many lifestyle modifications today in depth, and Blong will continue to work on decreasing saturated fats such as fatty red meat, butter and many fried foods. He will also increase vegetables and lean protein in his diet and continue to work on exercise  and weight loss efforts. We will re-check labs in 3 months and Cordelro agrees to follow up at agreed upon time.  Pre-Diabetes Landy will continue to work on weight loss, exercise, and decreasing simple carbohydrates in his diet to help decrease the risk of diabetes. We dicussed metformin including benefits and risks. He was informed that eating too many simple carbohydrates or too many calories at one sitting increases the likelihood of GI side effects. Shontez requested metformin for now and a prescription was written today for Metformin 500 mg 1 tablet by mouth qd #30 with no refills. We will re-check labs in 3 months and Jacquees agreed to follow up with [place] as directed to monitor his progress.  Diabetes risk counselling Joud was given extended (at least 30 minutes) diabetes prevention counseling today. He is 51 y.o. male and has risk factors for diabetes including obesity. We discussed intensive lifestyle modifications today with an emphasis on weight loss as well as increasing exercise and decreasing simple carbohydrates in his diet.  Obesity Olanda is currently in the action stage of change. As such, his goal is to continue with weight loss efforts He has agreed to keep a food journal with 450 to 650 calories and 40 grams of protein at supper and follow the Category 3 plan Pascal has been instructed to work up to a goal of 150 minutes of combined cardio and strengthening exercise per week for weight loss and overall health benefits. We discussed the following Behavioral Modification Strategies today: increasing lean protein intake, decreasing simple carbohydrates , increasing vegetables and decrease junk food  Mahlik has agreed to follow up with our clinic in 2 weeks. He was informed of the importance of frequent follow up visits to maximize his success with intensive lifestyle modifications for his multiple health conditions.  I, [place] Crane, am acting as scribe for Quillian Quince,  MD  I have reviewed the above documentation for accuracy and completeness, and I agree with the above. -Quillian Quince, MD  OBESITY BEHAVIORAL INTERVENTION VISIT  Today's visit was # 2 out of 22.  Starting weight: 297 lbs Starting date: 11/11/16 Today's weight : 292 lbs Today's date: 11/26/2016 Total lbs lost to date: 5 (Patients must lose 7 lbs in  the first 6 months to continue with counseling)   ASK: We discussed the diagnosis of obesity with Austin Hernandez today and Austin Hernandez agreed to give us permission to discuss obesity behavioral modification therapy today.  ASSESS: Austin Hernandez has the diagnosis of obesity and his BMI today is 38.6 Haskell is in the action stage of change   ADVISE: Austin Hernandez was educated on the multiple health risks of obesity as well as the benefit of weight loss to improve his health. He was advised of the need for long term treatment and the importance of lifestyle modifications.  AGREE: Multiple dietary modification options and treatment options were discussed and  Fredrik agreed to keep a food journal with 450 to 650 calories and 40 grams of protein at supper and follow the Category 3 plan  We discussed the following Behavioral Modification Strategies today: increasing lean protein intake, decreasing simple carbohydrates , increasing vegetables and decrease junk food

## 2016-12-09 ENCOUNTER — Ambulatory Visit (INDEPENDENT_AMBULATORY_CARE_PROVIDER_SITE_OTHER): Payer: BLUE CROSS/BLUE SHIELD | Admitting: Family Medicine

## 2016-12-09 VITALS — BP 124/81 | HR 75 | Temp 98.1°F | Ht 73.0 in | Wt 288.0 lb

## 2016-12-09 DIAGNOSIS — E669 Obesity, unspecified: Secondary | ICD-10-CM

## 2016-12-09 DIAGNOSIS — Z6838 Body mass index (BMI) 38.0-38.9, adult: Secondary | ICD-10-CM

## 2016-12-09 DIAGNOSIS — E559 Vitamin D deficiency, unspecified: Secondary | ICD-10-CM

## 2016-12-09 DIAGNOSIS — R7303 Prediabetes: Secondary | ICD-10-CM | POA: Diagnosis not present

## 2016-12-09 DIAGNOSIS — E119 Type 2 diabetes mellitus without complications: Secondary | ICD-10-CM | POA: Insufficient documentation

## 2016-12-09 DIAGNOSIS — E1165 Type 2 diabetes mellitus with hyperglycemia: Secondary | ICD-10-CM | POA: Insufficient documentation

## 2016-12-09 MED ORDER — VITAMIN D (ERGOCALCIFEROL) 1.25 MG (50000 UNIT) PO CAPS: 50000.0000 [IU] | ORAL_CAPSULE | ORAL | 0 refills | Status: DC

## 2016-12-09 MED ORDER — METFORMIN HCL 500 MG PO TABS: 500.0000 mg | ORAL_TABLET | Freq: Every day | ORAL | 0 refills | Status: DC

## 2016-12-09 NOTE — Progress Notes (Signed)
Office: [phone]  /  Fax: [phone]   HPI:   Chief Complaint: OBESITY Austin Hernandez is here to discuss his progress with his obesity treatment plan. He is on the  keep a food journal with 450 to 650 calories and 40 grams of protein at supper and follow the Category 3 plan and is following his eating plan approximately 80 % of the time. He states he is exercising 0 minutes 0 times per week. Austin Hernandez continues to do well with the plan. Hunger is controlled and he is not bored with the plan. Austin Hernandez sometimes struggles to eat all his food, he travels a lot and has questions about the best way to plan his food/meals/snacks. His weight is 288 lb (130.6 kg) today and has had a weight loss of 4 pounds over a period of 2 weeks since his last visit. He has lost 9 lbs since starting treatment with [place].  Vitamin D deficiency Austin Hernandez has a diagnosis of vitamin D deficiency. He is currently stable on vit D, not yet at goal and denies nausea, vomiting or muscle weakness.  Pre-Diabetes Austin Hernandez has a diagnosis of pre-diabetes based on his elevated Hgb A1c and was informed this puts him at greater risk of developing diabetes. He is stable on metformin currently and continues to work on diet and exercise to decrease risk of diabetes. He denies nausea, vomiting or hypoglycemia.   ALLERGIES: No Known Allergies  MEDICATIONS: Current Outpatient Prescriptions on File Prior to Visit  Medication Sig Dispense Refill  . metFORMIN (GLUCOPHAGE) 500 MG tablet Take 1 tablet (500 mg total) by mouth daily with breakfast. 30 tablet 0  . Vitamin D, Ergocalciferol, (DRISDOL) 50000 units CAPS capsule Take 1 capsule (50,000 Units total) by mouth every 7 (seven) days. 4 capsule 0   No current facility-administered medications on file prior to visit.     PAST MEDICAL HISTORY: Past Medical History:  Diagnosis Date  . Back pain   . GERD (gastroesophageal reflux disease)   . History of chicken pox   . HLD  (hyperlipidemia)   . Joint pain   . Restless leg   . Seasonal allergies   . Sleep apnea     PAST SURGICAL HISTORY: Past Surgical History:  Procedure Laterality Date  . ADENOIDECTOMY  1970  . ANKLE SURGERY  1980s   torn ligaments; bilateral; separate surgeries (4 total), sports    SOCIAL HISTORY: Social History  Substance Use Topics  . Smoking status: Former Smoker    Packs/day: 1.00    Years: 25.00    Types: Cigarettes    Quit date: 07/01/2010  . Smokeless tobacco: Never Used  . Alcohol use 3.0 - 3.6 oz/week    5 - 6 Glasses of wine per week     Comment: Occasional (weekends)    FAMILY HISTORY: Family History  Problem Relation Age of Onset  . Arthritis Mother   . Hypertension Mother   . Hyperlipidemia Mother   . Squamous cell carcinoma Father   . Cancer Father 37       throat cancer (squamous), smoker  . Stroke Maternal Grandmother 50  . Diabetes Maternal Grandmother   . Cancer Paternal Grandfather        lung?  . Coronary artery disease Neg Hx     ROS: Review of Systems  Constitutional: Positive for weight loss.  Gastrointestinal: Negative for nausea and vomiting.  Musculoskeletal:       Negative muscle weakness  Endo/Heme/Allergies:  Negative hypoglycemia    PHYSICAL EXAM: Blood pressure 124/81, pulse 75, temperature 98.1 F (36.7 C), temperature source Oral, height 6\' 1"  (1.854 m), weight 288 lb (130.6 kg), SpO2 96 %. Body mass index is 38 kg/m. Physical Exam  Constitutional: He is oriented to person, place, and time. He appears well-developed and well-nourished.  Cardiovascular: Normal rate.   Pulmonary/Chest: Effort normal.  Musculoskeletal: Normal range of motion.  Neurological: He is oriented to person, place, and time.  Skin: Skin is warm and dry.  Psychiatric: He has a normal mood and affect. His behavior is normal.  Vitals reviewed.   RECENT LABS AND TESTS: BMET    Component Value Date/Time   NA 142 11/11/2016 0954   K 4.1  11/11/2016 0954   CL 102 11/11/2016 0954   CO2 23 11/11/2016 0954   GLUCOSE 109 (H) 11/11/2016 0954   BUN 13 11/11/2016 0954   CREATININE 0.86 11/11/2016 0954   CALCIUM 8.8 11/11/2016 0954   GFRNONAA 100 11/11/2016 0954   GFRAA 115 11/11/2016 0954   Lab Results  Component Value Date   HGBA1C 6.4 (H) 11/11/2016   Lab Results  Component Value Date   INSULIN 28.8 (H) 11/11/2016   CBC    Component Value Date/Time   WBC 7.4 11/11/2016 0954   RBC 4.75 11/11/2016 0954   HGB 15.0 11/11/2016 0954   HCT 43.6 11/11/2016 0954   MCV 92 11/11/2016 0954   MCH 31.6 11/11/2016 0954   MCHC 34.4 11/11/2016 0954   RDW 14.4 11/11/2016 0954   LYMPHSABS 2.3 11/11/2016 0954   EOSABS 0.1 11/11/2016 0954   BASOSABS 0.0 11/11/2016 0954   Iron/TIBC/Ferritin/ %Sat No results found for: IRON, TIBC, FERRITIN, IRONPCTSAT Lipid Panel     Component Value Date/Time   CHOL 250 (H) 11/11/2016 0954   TRIG 156 (H) 11/11/2016 0954   HDL 38 (L) 11/11/2016 0954   CHOLHDL 8.3 CALC 03/28/2008 1101   VLDL 22 03/28/2008 1101   LDLCALC 181 (H) 11/11/2016 0954   LDLDIRECT 239.3 03/28/2008 1101   Hepatic Function Panel     Component Value Date/Time   PROT 6.5 11/11/2016 0954   ALBUMIN 4.0 11/11/2016 0954   AST 34 11/11/2016 0954   ALT 92 (H) 11/11/2016 0954   ALKPHOS 76 11/11/2016 0954   BILITOT 0.4 11/11/2016 0954   BILIDIR 0.1 03/28/2008 1101      Component Value Date/Time   TSH 3.260 11/11/2016 0954    ASSESSMENT AND PLAN: Prediabetes - Plan: metFORMIN (GLUCOPHAGE) 500 MG tablet  Vitamin D deficiency - Plan: Vitamin D, Ergocalciferol, (DRISDOL) 50000 units CAPS capsule  Class 2 obesity without serious comorbidity with body mass index (BMI) of 38.0 to 38.9 in adult, unspecified obesity type  PLAN:  Vitamin D Deficiency Karlos was informed that low vitamin D levels contributes to fatigue and are associated with obesity, breast, and colon cancer. He agrees to continue to take prescription Vit  D @50 ,000 IU every week, we will refill for 1 month and will follow up for routine testing of vitamin D, at least 2-3 times per year. He was informed of the risk of over-replacement of vitamin D and agrees to not increase his dose unless he discusses this with [place] first. Austin Hernandez agrees to follow up with our clinic in 2 weeks.  Pre-Diabetes Austin Hernandez will continue to work on weight loss, exercise, and decreasing simple carbohydrates in his diet to help decrease the risk of diabetes. We dicussed metformin including benefits and risks. He was  informed that eating too many simple carbohydrates or too many calories at one sitting increases the likelihood of GI side effects. Dhani agrees to continue metformin for now and a prescription was written today for 1 month refill. Austin Hernandez agreed to follow up with us as directed to monitor his progress.  Obesity Austin Hernandez is currently in the action stage of change. As such, his goal is to continue with weight loss efforts He has agreed to keep a food journal with 400 to 650 calories and 40+ grams of protein at supper and follow the Category 3 plan Austin Hernandez has been instructed to work up to a goal of 150 minutes of combined cardio and strengthening exercise per week for weight loss and overall health benefits. We discussed the following Behavioral Modification Strategies today: increasing lean protein intake, meal planning & cooking strategies and travel eating strategies.   Austin Hernandez has agreed to follow up with our clinic in 2 weeks. He was informed of the importance of frequent follow up visits to maximize his success with intensive lifestyle modifications for his multiple health conditions.  I, [place] CraneJoanne Murray, am acting as scribe for Quillian Quincearen Farra Nikolic, MD  I have reviewed the above documentation for accuracy and completeness, and I agree with the above. -Quillian Quincearen Annaelle Kasel, MD  OBESITY BEHAVIORAL INTERVENTION VISIT  Today's visit was # 3 out of 22.  Starting weight: 297  lbs Starting date: 11/11/16 Today's weight : 288 lbs Today's date: 12/09/2016 Total lbs lost to date: 9 (Patients must lose 7 lbs in the first 6 months to continue with counseling)   ASK: We discussed the diagnosis of obesity with Jairo Benarsten Dewing today and Austin Hernandez agreed to give us permission to discuss obesity behavioral modification therapy today.  ASSESS: Austin Hernandez has the diagnosis of obesity and his BMI today is 38.1 Viyan is in the action stage of change   ADVISE: Austin Hernandez was educated on the multiple health risks of obesity as well as the benefit of weight loss to improve his health. He was advised of the need for long term treatment and the importance of lifestyle modifications.  AGREE: Multiple dietary modification options and treatment options were discussed and  Brown agreed to keep a food journal with 400 to 650 calories and 40+ grams of protein at supper and follow the Category 3 plan We discussed the following Behavioral Modification Strategies today: increasing lean protein intake, meal planning & cooking strategies and travel eating strategies.

## 2016-12-10 DIAGNOSIS — G4733 Obstructive sleep apnea (adult) (pediatric): Secondary | ICD-10-CM | POA: Diagnosis not present

## 2016-12-11 ENCOUNTER — Telehealth: Payer: Self-pay | Admitting: Pulmonary Disease

## 2016-12-11 ENCOUNTER — Encounter: Payer: Self-pay | Admitting: Pulmonary Disease

## 2016-12-11 ENCOUNTER — Other Ambulatory Visit: Payer: Self-pay | Admitting: *Deleted

## 2016-12-11 DIAGNOSIS — G4733 Obstructive sleep apnea (adult) (pediatric): Secondary | ICD-10-CM

## 2016-12-11 DIAGNOSIS — R0683 Snoring: Secondary | ICD-10-CM

## 2016-12-11 DIAGNOSIS — R29818 Other symptoms and signs involving the nervous system: Secondary | ICD-10-CM

## 2016-12-11 HISTORY — DX: Obstructive sleep apnea (adult) (pediatric): G47.33

## 2016-12-11 NOTE — Telephone Encounter (Signed)
HST 12/10/16 >> AHI 43.4, SaO2 low 68%   Will have my nurse inform pt that sleep study shows severe sleep apnea.  Options are 1) CPAP now, 2) ROV first.  If He is agreeable to CPAP, then please send order for auto CPAP range 5 to 15 cm H2O with heated humidity and mask of choice.  Have download sent 1 month after starting CPAP and set up ROV 2 months after starting CPAP.  ROV can be with me or NP.

## 2016-12-11 NOTE — Telephone Encounter (Signed)
Contacted patient and made him aware of results and recommendations. Pt wanted an OV for an in depth explanation of how the CPAP works (patient was given a generic explanation of device operation), but VS is booked until September and patient will be out of town for the next 3 weeks.    Will route to VS to contact patient via phone or how to proceed.

## 2016-12-12 NOTE — Telephone Encounter (Signed)
Can schedule visit with NP.

## 2016-12-12 NOTE — Telephone Encounter (Signed)
LMTCB to schedule ov with NP to review sleep study results

## 2016-12-12 NOTE — Telephone Encounter (Signed)
Appoint scheduled w/SG 6/18 @ 12//sad

## 2016-12-12 NOTE — Telephone Encounter (Signed)
Pt returned call looking for time to sched his appoint w/NP now.Caren GriffinsStanley A Dalton

## 2016-12-16 ENCOUNTER — Encounter: Payer: Self-pay | Admitting: Acute Care

## 2016-12-16 ENCOUNTER — Ambulatory Visit (INDEPENDENT_AMBULATORY_CARE_PROVIDER_SITE_OTHER): Payer: BLUE CROSS/BLUE SHIELD | Admitting: Acute Care

## 2016-12-16 VITALS — BP 118/82 | HR 84 | Ht 73.0 in | Wt 290.6 lb

## 2016-12-16 DIAGNOSIS — J309 Allergic rhinitis, unspecified: Secondary | ICD-10-CM

## 2016-12-16 DIAGNOSIS — K219 Gastro-esophageal reflux disease without esophagitis: Secondary | ICD-10-CM

## 2016-12-16 DIAGNOSIS — R0681 Apnea, not elsewhere classified: Secondary | ICD-10-CM

## 2016-12-16 DIAGNOSIS — G4733 Obstructive sleep apnea (adult) (pediatric): Secondary | ICD-10-CM

## 2016-12-16 NOTE — Assessment & Plan Note (Addendum)
Severe OSA with AHI of 43.4 events per hour, and desaturation low of 68% Plan: We will initiate CPAP therapy. We will order the CPAP device for you We will have you go for a mask fitting. AutoSet 5-15 cm H2O Return to the office 1 month after you start use of the CPAP machine and we will check a downLoad to see if you are benefiting from treatment or if we need to make adjustments. Goal is to wear CPAP for at least 4-6 hours each night for maximal clinical benefit. Continue to work on weight loss, as the link between excess weight  and sleep apnea is well established.  Do not drive if sleepy.

## 2016-12-16 NOTE — Assessment & Plan Note (Signed)
For nasal stuffiness, use saline mist, blow nose, then use Flonase 2 puffs in each nostril once daily Claritin 10 mg once daily at bedtime for PND. Sips of water for throat soothing instead of throat clearing. Sugar free candy to throat sooth as needed Please contact office for sooner follow up if symptoms do not improve or worsen or seek emergency care  Follow up 4 weeks after starting CPAP with Kandice RobinsonsSarah Groce, NP

## 2016-12-16 NOTE — Progress Notes (Signed)
I have reviewed and agree with assessment/plan.  Coralyn HellingVineet Tameah Mihalko, MD [facility] 12/16/2016, 3:12 PM Pager:  [phone]

## 2016-12-16 NOTE — Patient Instructions (Addendum)
It is nice to meet you today. We will initiate CPAP therapy. We will order the CPAP device for you We will have you go for a mask fitting. AutoSet 5-15 cm H2O Return to the office 1 month after you start use of the CPAP machine and we will check a downLoad to see if you are benefiting from treatment or if we need to make adjustments. Goal is to wear CPAP for at least 4-6 hours each night for maximal clinical benefit. Continue to work on weight loss, as the link between excess weight  and sleep apnea is well established.  Do not drive if sleepy. For nasal stuffiness, use saline mist, blow nose, then use Flonase 2 puffs in each nostril once daily Claritin 10 mg once daily at bedtime for PND. Omeprazole daily for your reflux. GERD diet Sips of water for throat soothing instead of throat clearing. Sugar free candy to throat sooth as needed Please contact office for sooner follow up if symptoms do not improve or worsen or seek emergency care  Follow up 4 weeks after starting CPAP with Kandice RobinsonsSarah Groce, NP  .

## 2016-12-16 NOTE — Progress Notes (Signed)
History of Present Illness Austin Hernandez is a 51 y.o. male former smoker with severe OSA per home sleep study 11/2016. He is followed by  Craige Cotta.   12/16/2016 Follow up for OSA, questions about initiation of CPAP Therapy Pt. Presents for follow up after home sleep study. Home sleep study done 12/10/2016 indicated severe OSA with AHI of 43.4 events  per hour of sleep, with de saturation low  68%. Recommendation per Dr. Craige Cotta was that given severity of sleep apnea he should be tried on CPAP therapy first. Patient presents today with many questions and concerns regarding CPAP therapy. We discussed the risks of untreated severe OSA to include hypertension,stroke  cardiovascular disease, and diabetes.  We also discussed how sleep disruption can increase risks for accidents, such as while driving. He states he is currently not having any issues with daytime sleepiness. He states he feels great at the beginning of every day. However he has witnessed apneas per his wife, and now sleep study which confirms severe obstructive sleep apnea. Patient denies fever, chest pain, orthopnea, or hemoptysis.   Pt states he has had a chronic stuffy nose since he was a young child. Additionally, he has frequent throat clearing in the office today. He has been diagnosed with GERD, but does not take his omeprazole regularly. He states he does have PND but does not take an antihistamine, or use any nasal spray.  Test Results: HST 12/10/16 >> AHI 43.4, SaO2 low 68%  CBC Latest Ref Rng & Units 11/11/2016  WBC 3.4 - 10.8 x10E3/uL 7.4  Hemoglobin 13.0 - 17.7 g/dL 54.0  Hematocrit 98.1 - 51.0 % 43.6    BMP Latest Ref Rng & Units 11/11/2016  Glucose 65 - 99 mg/dL 191(Y)  BUN 6 - 24 mg/dL 13  Creatinine 7.82 - 9.56 mg/dL 2.13  BUN/Creat Ratio 9 - 20 15  Sodium 134 - 144 mmol/L 142  Potassium 3.5 - 5.2 mmol/L 4.1  Chloride 96 - 106 mmol/L 102  CO2 18 - 29 mmol/L 23  Calcium 8.7 - 10.2 mg/dL 8.8    BNP No results found  for: BNP  ProBNP No results found for: PROBNP  PFT No results found for: FEV1PRE, FEV1POST, FVCPRE, FVCPOST, TLC, DLCOUNC, PREFEV1FVCRT, PSTFEV1FVCRT   Past medical hx Past Medical History:  Diagnosis Date  . Back pain   . GERD (gastroesophageal reflux disease)   . History of chicken pox   . HLD (hyperlipidemia)   . Joint pain   . OSA (obstructive sleep apnea) 12/11/2016  . Restless leg   . Seasonal allergies   . Sleep apnea      Social History  Substance Use Topics  . Smoking status: Former Smoker    Packs/day: 1.00    Years: 25.00    Types: Cigarettes    Quit date: 07/01/2010  . Smokeless tobacco: Never Used  . Alcohol use 3.0 - 3.6 oz/week    5 - 6 Glasses of wine per week     Comment: Occasional (weekends)    Tobacco Cessation: Former smoker quit 2012 with a 25-pack-year smoking history  Past surgical hx, Family hx, Social hx all reviewed.  Current Outpatient Prescriptions on File Prior to Visit  Medication Sig  . metFORMIN (GLUCOPHAGE) 500 MG tablet Take 1 tablet (500 mg total) by mouth daily with breakfast.  . Vitamin D, Ergocalciferol, (DRISDOL) 50000 units CAPS capsule Take 1 capsule (50,000 Units total) by mouth every 7 (seven) days.   No current facility-administered medications on  file prior to visit.      No Known Allergies  Review Of Systems:  Constitutional:   No  weight loss, night sweats,  Fevers, chills, fatigue, or  lassitude.  HEENT:   No headaches,  Difficulty swallowing,  Tooth/dental problems, or  Sore throat,                No sneezing, itching, ear ache, +nasal congestion,+post nasal drip,   CV:  No chest pain,  Orthopnea, PND, swelling in lower extremities, anasarca, dizziness, palpitations, syncope.   GI  No heartburn, indigestion, abdominal pain, nausea, vomiting, diarrhea, change in bowel habits, loss of appetite, bloody stools.   Resp: No shortness of breath with exertion or at rest.  No excess mucus, no productive cough,  No  non-productive cough,  + coughing up of blood.  No change in color of mucus.  No wheezing.  No chest wall deformity  Skin: no rash or lesions.  GU: no dysuria, change in color of urine, no urgency or frequency.  No flank pain, no hematuria   MS:  No joint pain or swelling.  No decreased range of motion.  No back pain.  Psych:  No change in mood or affect. No depression or anxiety.  No memory loss.   Vital Signs BP 118/82 (BP Location: Right Arm, Cuff Size: Large)   Pulse 84   Ht 6\' 1"  (1.854 m)   Wt 290 lb 9.6 oz (131.8 kg)   SpO2 97%   BMI 38.34 kg/m    Physical Exam:  General- No distress,  A&Ox3, pleasant ENT: No sinus tenderness, TM clear, pale nasal mucosa, no oral exudate,+ post nasal drip, no LAN, frequent throat clearing in the office today Cardiac: S1, S2, regular rate and rhythm, no murmur Chest: No wheeze/ rales/ dullness; no accessory muscle use, no nasal flaring, no sternal retractions Abd.: Soft Non-tender, obese Ext: No clubbing cyanosis, edema Neuro:  normal strength Skin: No rashes, warm and dry Psych: normal mood and behavior   Assessment/Plan  OSA (obstructive sleep apnea) Severe OSA with AHI of 43.4 events per hour, and desaturation low of 68% Plan: We will initiate CPAP therapy. We will order the CPAP device for you We will have you go for a mask fitting. AutoSet 5-15 cm H2O Return to the office 1 month after you start use of the CPAP machine and we will check a downLoad to see if you are benefiting from treatment or if we need to make adjustments. Goal is to wear CPAP for at least 4-6 hours each night for maximal clinical benefit. Continue to work on weight loss, as the link between excess weight  and sleep apnea is well established.  Do not drive if sleepy.  GERD with apnea Plan Omeprazole daily for your reflux as previously prescribed. GERD diet ( Copy to patient) Please contact office for sooner follow up if symptoms do not improve or  worsen or seek emergency care  Follow up 4 weeks after starting CPAP with Kandice RobinsonsSarah Fallan Mccarey, NP   Allergic rhinitis For nasal stuffiness, use saline mist, blow nose, then use Flonase 2 puffs in each nostril once daily Claritin 10 mg once daily at bedtime for PND. Sips of water for throat soothing instead of throat clearing. Sugar free candy to throat sooth as needed Please contact office for sooner follow up if symptoms do not improve or worsen or seek emergency care  Follow up 4 weeks after starting CPAP with Kandice RobinsonsSarah Hawke Villalpando, NP  Bevelyn Ngo, NP 12/16/2016  1:43 PM

## 2016-12-16 NOTE — Assessment & Plan Note (Addendum)
Plan Omeprazole daily for your reflux as previously prescribed. GERD diet ( Copy to patient) Please contact office for sooner follow up if symptoms do not improve or worsen or seek emergency care  Follow up 4 weeks after starting CPAP with Kandice RobinsonsSarah Arjan Strohm, NP

## 2016-12-23 ENCOUNTER — Telehealth (INDEPENDENT_AMBULATORY_CARE_PROVIDER_SITE_OTHER): Payer: Self-pay | Admitting: Family Medicine

## 2016-12-23 ENCOUNTER — Telehealth (INDEPENDENT_AMBULATORY_CARE_PROVIDER_SITE_OTHER): Payer: Self-pay

## 2016-12-23 NOTE — Telephone Encounter (Signed)
metFORMIN (GLUCOPHAGE) 500 MG tablet  Pt is leaving town in the morning at 5 am, not enough for vacation, pharmacy told pt can't be refilled until next week.  Requesting a call be made to pharmacy to get enough meds for vacation.  Call pt at [phone] if there is a problem

## 2016-12-24 ENCOUNTER — Telehealth (INDEPENDENT_AMBULATORY_CARE_PROVIDER_SITE_OTHER): Payer: Self-pay

## 2016-12-24 NOTE — Telephone Encounter (Signed)
error 

## 2016-12-24 NOTE — Telephone Encounter (Deleted)
error 

## 2017-01-06 ENCOUNTER — Ambulatory Visit (INDEPENDENT_AMBULATORY_CARE_PROVIDER_SITE_OTHER): Payer: BLUE CROSS/BLUE SHIELD | Admitting: Family Medicine

## 2017-01-06 VITALS — BP 118/76 | HR 82 | Temp 97.9°F | Ht 73.0 in | Wt 288.0 lb

## 2017-01-06 DIAGNOSIS — Z6838 Body mass index (BMI) 38.0-38.9, adult: Secondary | ICD-10-CM

## 2017-01-06 DIAGNOSIS — R7303 Prediabetes: Secondary | ICD-10-CM | POA: Diagnosis not present

## 2017-01-06 DIAGNOSIS — E669 Obesity, unspecified: Secondary | ICD-10-CM | POA: Diagnosis not present

## 2017-01-06 DIAGNOSIS — IMO0001 Reserved for inherently not codable concepts without codable children: Secondary | ICD-10-CM

## 2017-01-07 NOTE — Progress Notes (Signed)
Office: [phone]  /  Fax: [phone]   HPI:   Chief Complaint: OBESITY Austin Hernandez is here to discuss his progress with his obesity treatment plan. He is on the  follow the Category 3 plan and is following his eating plan approximately 50 % of the time. He states he is exercising 0 minutes 0 times per week. Austin Hernandez did well maintaining weight on vacation. He did well increasing protein and trying to portion control most of the time. They increased walking. Austin Hernandez is ready to get back on track. His weight is 288 lb (130.6 kg) today and has maintained weight over a period of 4 weeks since his last visit. He has lost 9 lbs since starting treatment with us.  Pre-Diabetes Austin Hernandez has a diagnosis of pre-diabetes based on his elevated Hb A1c and was informed this puts him at greater risk of developing diabetes. He is stable on metformin currently and is doing well with diet overall. He continues to work on diet and exercise to decrease risk of diabetes. He denies nausea, vomiting or hypoglycemia.  ALLERGIES: No Known Allergies  MEDICATIONS: Current Outpatient Prescriptions on File Prior to Visit  Medication Sig Dispense Refill   metFORMIN (GLUCOPHAGE) 500 MG tablet Take 1 tablet (500 mg total) by mouth daily with breakfast. 30 tablet 0   Vitamin D, Ergocalciferol, (DRISDOL) 50000 units CAPS capsule Take 1 capsule (50,000 Units total) by mouth every 7 (seven) days. 4 capsule 0   No current facility-administered medications on file prior to visit.     PAST MEDICAL HISTORY: Past Medical History:  Diagnosis Date   Back pain    GERD (gastroesophageal reflux disease)    History of chicken pox    HLD (hyperlipidemia)    Joint pain    OSA (obstructive sleep apnea) 12/11/2016   Restless leg    Seasonal allergies    Sleep apnea     PAST SURGICAL HISTORY: Past Surgical History:  Procedure Laterality Date   ADENOIDECTOMY  1970   ANKLE SURGERY  1980s   torn ligaments;  bilateral; separate surgeries (4 total), sports    SOCIAL HISTORY: Social History  Substance Use Topics   Smoking status: Former Smoker    Packs/day: 1.00    Years: 25.00    Types: Cigarettes    Quit date: 07/01/2010   Smokeless tobacco: Never Used   Alcohol use 3.0 - 3.6 oz/week    5 - 6 Glasses of wine per week     Comment: Occasional (weekends)    FAMILY HISTORY: Family History  Problem Relation Age of Onset   Arthritis Mother    Hypertension Mother    Hyperlipidemia Mother    Squamous cell carcinoma Father    Cancer Father [id number]       throat cancer (squamous), smoker   Stroke Maternal Grandmother 50   Diabetes Maternal Grandmother    Cancer Paternal Grandfather        lung?   Coronary artery disease Neg Hx     ROS: Review of Systems  Constitutional: Negative for weight loss.  Gastrointestinal: Negative for nausea and vomiting.  Endo/Heme/Allergies:       Negative hypoglycemia    PHYSICAL EXAM: Blood pressure 118/76, pulse 82, temperature 97.9 F (36.6 C), temperature source Oral, height 6\' 1"  (1.854 m), weight 288 lb (130.6 kg), SpO2 96 %. Body mass index is 38 kg/m. Physical Exam  Constitutional: He is oriented to person, place, and time. He appears well-developed and well-nourished.  Cardiovascular: Normal rate.  Pulmonary/Chest: Effort normal.  Musculoskeletal: Normal range of motion.  Neurological: He is oriented to person, place, and time.  Skin: Skin is warm and dry.  Psychiatric: He has a normal mood and affect. His behavior is normal.  Vitals reviewed.   RECENT LABS AND TESTS: BMET    Component Value Date/Time   NA 142 11/11/2016 0954   K 4.1 11/11/2016 0954   CL 102 11/11/2016 0954   CO2 23 11/11/2016 0954   GLUCOSE 109 (H) 11/11/2016 0954   BUN 13 11/11/2016 0954   CREATININE 0.86 11/11/2016 0954   CALCIUM 8.8 11/11/2016 0954   GFRNONAA 100 11/11/2016 0954   GFRAA 115 11/11/2016 0954   Lab Results  Component Value Date    HGBA1C 6.4 (H) 11/11/2016   Lab Results  Component Value Date   INSULIN 28.8 (H) 11/11/2016   CBC    Component Value Date/Time   WBC 7.4 11/11/2016 0954   RBC 4.75 11/11/2016 0954   HGB 15.0 11/11/2016 0954   HCT 43.6 11/11/2016 0954   MCV 92 11/11/2016 0954   MCH 31.6 11/11/2016 0954   MCHC 34.4 11/11/2016 0954   RDW 14.4 11/11/2016 0954   LYMPHSABS 2.3 11/11/2016 0954   EOSABS 0.1 11/11/2016 0954   BASOSABS 0.0 11/11/2016 0954   Iron/TIBC/Ferritin/ %Sat No results found for: IRON, TIBC, FERRITIN, IRONPCTSAT Lipid Panel     Component Value Date/Time   CHOL 250 (H) 11/11/2016 0954   TRIG 156 (H) 11/11/2016 0954   HDL 38 (L) 11/11/2016 0954   CHOLHDL 8.3 CALC 03/28/2008 1101   VLDL 22 03/28/2008 1101   LDLCALC 181 (H) 11/11/2016 0954   LDLDIRECT 239.3 03/28/2008 1101   Hepatic Function Panel     Component Value Date/Time   PROT 6.5 11/11/2016 0954   ALBUMIN 4.0 11/11/2016 0954   AST 34 11/11/2016 0954   ALT 92 (H) 11/11/2016 0954   ALKPHOS 76 11/11/2016 0954   BILITOT 0.4 11/11/2016 0954   BILIDIR 0.1 03/28/2008 1101      Component Value Date/Time   TSH 3.260 11/11/2016 0954    ASSESSMENT AND PLAN: Prediabetes  Class 2 obesity with serious comorbidity and body mass index (BMI) of 38.0 to 38.9 in adult, unspecified obesity type  PLAN:  Pre-Diabetes Austin Hernandez will continue to work on weight loss, exercise, and decreasing simple carbohydrates in his diet to help decrease the risk of diabetes. We dicussed metformin including benefits and risks. He was informed that eating too many simple carbohydrates or too many calories at one sitting increases the likelihood of GI side effects. Austin Hernandez agrees to continue to take metformin for now and a prescription was not written today. Austin Hernandez agreed to follow up with [place] as directed to monitor his progress.  Obesity Austin Hernandez is currently in the action stage of change. As such, his goal is to continue with weight loss  efforts He has agreed to follow the Category 3 plan Austin Hernandez has been instructed to work up to a goal of 150 minutes of combined cardio and strengthening exercise per week or swimming for 30 to 60 minutes 2 times per week for exercise, weight loss and overall health benefits. We discussed the following Behavioral Modification Strategies today: increasing lean protein intake and meal planning & cooking strategies  Austin Hernandez has agreed to follow up with our clinic in 2 to 3 weeks. He was informed of the importance of frequent follow up visits to maximize his success with intensive lifestyle modifications for his multiple health conditions.  I, Nevada Crane, am acting as transcriptionist for Quillian Quince, MD  I have reviewed the above documentation for accuracy and completeness, and I agree with the above. -Quillian Quince, MD   OBESITY BEHAVIORAL INTERVENTION VISIT  Today's visit was # 4 out of 22.  Starting weight: 297 lbs Starting date: 11/11/16 Today's weight : 288 lbs Today's date: 01/06/2017 Total lbs lost to date: 9 (Patients must lose 7 lbs in the first 6 months to continue with counseling)   ASK: We discussed the diagnosis of obesity with Austin Hernandez today and Austin Hernandez agreed to give [place] permission to discuss obesity behavioral modification therapy today.  ASSESS: Austin Hernandez has the diagnosis of obesity and his BMI today is 38.1 Austin Hernandez is in the action stage of change   ADVISE: Austin Hernandez was educated on the multiple health risks of obesity as well as the benefit of weight loss to improve his health. He was advised of the need for long term treatment and the importance of lifestyle modifications.  AGREE: Multiple dietary modification options and treatment options were discussed and  Austin Hernandez agreed to follow the Category 3 plan We discussed the following Behavioral Modification Strategies today: increasing lean protein intake and meal planning & cooking strategies

## 2017-01-22 ENCOUNTER — Ambulatory Visit (INDEPENDENT_AMBULATORY_CARE_PROVIDER_SITE_OTHER): Payer: BLUE CROSS/BLUE SHIELD | Admitting: Family Medicine

## 2017-01-22 VITALS — BP 117/77 | HR 70 | Temp 97.8°F | Ht 73.0 in | Wt 283.0 lb

## 2017-01-22 DIAGNOSIS — E669 Obesity, unspecified: Secondary | ICD-10-CM | POA: Diagnosis not present

## 2017-01-22 DIAGNOSIS — Z6837 Body mass index (BMI) 37.0-37.9, adult: Secondary | ICD-10-CM | POA: Diagnosis not present

## 2017-01-22 DIAGNOSIS — R7303 Prediabetes: Secondary | ICD-10-CM

## 2017-01-22 DIAGNOSIS — IMO0001 Reserved for inherently not codable concepts without codable children: Secondary | ICD-10-CM

## 2017-01-22 NOTE — Progress Notes (Signed)
Office: [phone]  /  Fax: [phone]   HPI:   Chief Complaint: OBESITY Austin Hernandez is here to discuss his progress with his obesity treatment plan. He is on the  follow the Category 3 plan and is following his eating plan approximately 80 to 85 % of the time. He states he is walking for 45 to 50 minutes 1 time per week. Austin Hernandez continues to lose weight well. His hunger is controlled but he is deviating from the plan more. He is trying to portion control better and make smarter choices. His exercise is doing well and he notes feeling better and sleeping better. His weight is 283 lb (128.4 kg) today and has had a weight loss of 5 pounds over a period of 2 weeks since his last visit. He has lost 14 lbs since starting treatment with [place].  Pre-Diabetes Austin Hernandez has a diagnosis of pre-diabetes based on his elevated Hgb A1c and was informed this puts him at greater risk of developing diabetes. He is stable on metformin currently and has no GI upset. Austin Hernandez continues to work on diet and exercise to decrease risk of diabetes. He has decreased polyphagia and denies nausea or hypoglycemia.    ALLERGIES: No Known Allergies  MEDICATIONS: Current Outpatient Prescriptions on File Prior to Visit  Medication Sig Dispense Refill   metFORMIN (GLUCOPHAGE) 500 MG tablet Take 1 tablet (500 mg total) by mouth daily with breakfast. 30 tablet 0   Vitamin D, Ergocalciferol, (DRISDOL) 50000 units CAPS capsule Take 1 capsule (50,000 Units total) by mouth every 7 (seven) days. 4 capsule 0   No current facility-administered medications on file prior to visit.     PAST MEDICAL HISTORY: Past Medical History:  Diagnosis Date   Back pain    GERD (gastroesophageal reflux disease)    History of chicken pox    HLD (hyperlipidemia)    Joint pain    OSA (obstructive sleep apnea) 12/11/2016   Restless leg    Seasonal allergies    Sleep apnea     PAST SURGICAL HISTORY: Past Surgical History:    Procedure Laterality Date   ADENOIDECTOMY  1970   ANKLE SURGERY  1980s   torn ligaments; bilateral; separate surgeries (4 total), sports    SOCIAL HISTORY: Social History  Substance Use Topics   Smoking status: Former Smoker    Packs/day: 1.00    Years: 25.00    Types: Cigarettes    Quit date: 07/01/2010   Smokeless tobacco: Never Used   Alcohol use 3.0 - 3.6 oz/week    5 - 6 Glasses of wine per week     Comment: Occasional (weekends)    FAMILY HISTORY: Family History  Problem Relation Age of Onset   Arthritis Mother    Hypertension Mother    Hyperlipidemia Mother    Squamous cell carcinoma Father    Cancer Father 52       throat cancer (squamous), smoker   Stroke Maternal Grandmother 50   Diabetes Maternal Grandmother    Cancer Paternal Grandfather        lung?   Coronary artery disease Neg Hx     ROS: Review of Systems  Constitutional: Positive for weight loss.  Gastrointestinal: Negative for nausea.  Endo/Heme/Allergies:       Polyphagia Negative hypoglycemia    PHYSICAL EXAM: Blood pressure 117/77, pulse 70, temperature 97.8 F (36.6 C), height 6\' 1"  (1.854 m), weight 283 lb (128.4 kg), SpO2 97 %. Body mass index is 37.34 kg/m. Physical  Exam  Constitutional: He is oriented to person, place, and time. He appears well-developed and well-nourished.  Cardiovascular: Normal rate.   Pulmonary/Chest: Effort normal.  Musculoskeletal: Normal range of motion.  Neurological: He is oriented to person, place, and time.  Skin: Skin is warm and dry.  Psychiatric: He has a normal mood and affect. His behavior is normal.  Vitals reviewed.   RECENT LABS AND TESTS: BMET    Component Value Date/Time   NA 142 11/11/2016 0954   K 4.1 11/11/2016 0954   CL 102 11/11/2016 0954   CO2 23 11/11/2016 0954   GLUCOSE 109 (H) 11/11/2016 0954   BUN 13 11/11/2016 0954   CREATININE 0.86 11/11/2016 0954   CALCIUM 8.8 11/11/2016 0954   GFRNONAA 100 11/11/2016  0954   GFRAA 115 11/11/2016 0954   Lab Results  Component Value Date   HGBA1C 6.4 (H) 11/11/2016   Lab Results  Component Value Date   INSULIN 28.8 (H) 11/11/2016   CBC    Component Value Date/Time   WBC 7.4 11/11/2016 0954   RBC 4.75 11/11/2016 0954   HGB 15.0 11/11/2016 0954   HCT 43.6 11/11/2016 0954   MCV 92 11/11/2016 0954   MCH 31.6 11/11/2016 0954   MCHC 34.4 11/11/2016 0954   RDW 14.4 11/11/2016 0954   LYMPHSABS 2.3 11/11/2016 0954   EOSABS 0.1 11/11/2016 0954   BASOSABS 0.0 11/11/2016 0954   Iron/TIBC/Ferritin/ %Sat No results found for: IRON, TIBC, FERRITIN, IRONPCTSAT Lipid Panel     Component Value Date/Time   CHOL 250 (H) 11/11/2016 0954   TRIG 156 (H) 11/11/2016 0954   HDL 38 (L) 11/11/2016 0954   CHOLHDL 8.3 CALC 03/28/2008 1101   VLDL 22 03/28/2008 1101   LDLCALC 181 (H) 11/11/2016 0954   LDLDIRECT 239.3 03/28/2008 1101   Hepatic Function Panel     Component Value Date/Time   PROT 6.5 11/11/2016 0954   ALBUMIN 4.0 11/11/2016 0954   AST 34 11/11/2016 0954   ALT 92 (H) 11/11/2016 0954   ALKPHOS 76 11/11/2016 0954   BILITOT 0.4 11/11/2016 0954   BILIDIR 0.1 03/28/2008 1101      Component Value Date/Time   TSH 3.260 11/11/2016 0954    ASSESSMENT AND PLAN: Prediabetes  Class 2 obesity with serious comorbidity and body mass index (BMI) of 37.0 to 37.9 in adult, unspecified obesity type  PLAN:  Pre-Diabetes Austin Hernandez will continue to work on weight loss, exercise, and decreasing simple carbohydrates in his diet to help decrease the risk of diabetes. We dicussed metformin including benefits and risks. He was informed that eating too many simple carbohydrates or too many calories at one sitting increases the likelihood of GI side effects. Austin Hernandez agrees to continue metformin for now and a prescription was not written today. Austin Hernandez agreed to follow up with us as directed to monitor his progress.  Obesity Austin Hernandez is currently in the action stage  of change. As such, his goal is to continue with weight loss efforts He has agreed to portion control better and make smarter food choices, such as increase vegetables and decrease simple carbohydrates  Austin Hernandez has been instructed to work up to a goal of 150 minutes of combined cardio and strengthening exercise per week or continue walking for 45 to 50 minutes 1 time per week for weight loss and overall health benefits. We discussed the following Behavioral Modification Strategies today: increasing lean protein intake, meal planning & cooking strategies and keeping healthy foods in the home.  Austin Hernandez has agreed to follow up with our clinic in 2 to 3 weeks. He was informed of the importance of frequent follow up visits to maximize his success with intensive lifestyle modifications for his multiple health conditions.  We spent > than 50% of the 15 minute visit on the counseling as documented in the note.  I, [place] CraneJoanne Murray, am acting as transcriptionist for Quillian Quincearen Beasley, MD  I have reviewed the above documentation for accuracy and completeness, and I agree with the above. -Quillian Quincearen Beasley, MD   OBESITY BEHAVIORAL INTERVENTION VISIT  Today's visit was # 5 out of 22.  Starting weight: 297 lbs Starting date: 11/11/16 Today's weight : 283 lbs  Today's date: 01/22/2017 Total lbs lost to date: 14 (Patients must lose 7 lbs in the first 6 months to continue with counseling)   ASK: We discussed the diagnosis of obesity with Austin Hernandez today and Austin Hernandez agreed to give us permission to discuss obesity behavioral modification therapy today.  ASSESS: Austin Hernandez has the diagnosis of obesity and his BMI today is 37.4 Austin Hernandez is in the action stage of change   ADVISE: Austin Hernandez was educated on the multiple health risks of obesity as well as the benefit of weight loss to improve his health. He was advised of the need for long term treatment and the importance of lifestyle  modifications.  AGREE: Multiple dietary modification options and treatment options were discussed and  Austin Hernandez agreed to portion control better and make smarter food choices, such as increase vegetables and decrease simple carbohydrates  We discussed the following Behavioral Modification Strategies today: increasing lean protein intake, meal planning & cooking strategies and keepinig healthy foods in the home

## 2017-02-04 NOTE — Telephone Encounter (Signed)
[place], verified that he has enough Metformin and Vit D to last hime until 7/9... his next appointment.

## 2017-02-10 ENCOUNTER — Ambulatory Visit (INDEPENDENT_AMBULATORY_CARE_PROVIDER_SITE_OTHER): Payer: BLUE CROSS/BLUE SHIELD | Admitting: Family Medicine

## 2017-02-10 VITALS — BP 126/81 | HR 67 | Temp 97.7°F | Ht 73.0 in | Wt 281.0 lb

## 2017-02-10 DIAGNOSIS — E559 Vitamin D deficiency, unspecified: Secondary | ICD-10-CM | POA: Diagnosis not present

## 2017-02-10 DIAGNOSIS — E669 Obesity, unspecified: Secondary | ICD-10-CM

## 2017-02-10 DIAGNOSIS — IMO0001 Reserved for inherently not codable concepts without codable children: Secondary | ICD-10-CM

## 2017-02-10 DIAGNOSIS — Z6837 Body mass index (BMI) 37.0-37.9, adult: Secondary | ICD-10-CM | POA: Diagnosis not present

## 2017-02-10 DIAGNOSIS — R7303 Prediabetes: Secondary | ICD-10-CM

## 2017-02-10 MED ORDER — METFORMIN HCL 500 MG PO TABS: 500.0000 mg | ORAL_TABLET | Freq: Every day | ORAL | 0 refills | Status: DC

## 2017-02-10 MED ORDER — VITAMIN D (ERGOCALCIFEROL) 1.25 MG (50000 UNIT) PO CAPS: 50000.0000 [IU] | ORAL_CAPSULE | ORAL | 0 refills | Status: DC

## 2017-02-10 NOTE — Progress Notes (Signed)
Office: [phone]  /  Fax: [phone]   HPI:   Chief Complaint: OBESITY Austin Hernandez is here to discuss his progress with his obesity treatment plan. He is on the  follow the Category 3 plan and is following his eating plan approximately 75 % of the time. He states he is walking 20 minutes 1 to 2 times per week. Austin Hernandez continues to do well with weight loss. He travels a lot for work but still tries to increase lean protein and vegetables. He also tries to decrease fried foods. His weight is 281 lb (127.5 kg) today and has had a weight loss of 2 pounds over a period of 3 weeks since his last visit. He has lost 16 lbs since starting treatment with [place].  Vitamin D deficiency Austin Hernandez has a diagnosis of vitamin D deficiency. He is currently stable on vit D, not yet at goal but fatigue is improving and he denies nausea, vomiting or muscle weakness.  Pre-Diabetes Austin Hernandez has a diagnosis of pre-diabetes based on his elevated Hgb A1c and was informed this puts him at greater risk of developing diabetes. He is stable on diet and metformin currently and he continues to work on diet and exercise to decrease risk of diabetes. He denies nausea, vomiting or hypoglycemia.   ALLERGIES: No Known Allergies  MEDICATIONS: No current outpatient prescriptions on file prior to visit.   No current facility-administered medications on file prior to visit.     PAST MEDICAL HISTORY: Past Medical History:  Diagnosis Date  . Back pain   . GERD (gastroesophageal reflux disease)   . History of chicken pox   . HLD (hyperlipidemia)   . Joint pain   . OSA (obstructive sleep apnea) 12/11/2016  . Restless leg   . Seasonal allergies   . Sleep apnea     PAST SURGICAL HISTORY: Past Surgical History:  Procedure Laterality Date  . ADENOIDECTOMY  1970  . ANKLE SURGERY  1980s   torn ligaments; bilateral; separate surgeries (4 total), sports    SOCIAL HISTORY: Social History  Substance Use Topics  . Smoking  status: Former Smoker    Packs/day: 1.00    Years: 25.00    Types: Cigarettes    Quit date: 07/01/2010  . Smokeless tobacco: Never Used  . Alcohol use 3.0 - 3.6 oz/week    5 - 6 Glasses of wine per week     Comment: Occasional (weekends)    FAMILY HISTORY: Family History  Problem Relation Age of Onset  . Arthritis Mother   . Hypertension Mother   . Hyperlipidemia Mother   . Squamous cell carcinoma Father   . Cancer Father 84       throat cancer (squamous), smoker  . Stroke Maternal Grandmother 50  . Diabetes Maternal Grandmother   . Cancer Paternal Grandfather        lung?  . Coronary artery disease Neg Hx     ROS: Review of Systems  Constitutional: Positive for malaise/fatigue and weight loss.  Gastrointestinal: Negative for nausea and vomiting.  Musculoskeletal:       Negative muscle weakness  Endo/Heme/Allergies:       Negative hypoglycemia    PHYSICAL EXAM: Blood pressure 126/81, pulse 67, temperature 97.7 F (36.5 C), height 6\' 1"  (1.854 m), weight 281 lb (127.5 kg), SpO2 96 %. Body mass index is 37.07 kg/m. Physical Exam  Constitutional: He is oriented to person, place, and time. He appears well-developed and well-nourished.  Cardiovascular: Normal rate.   Pulmonary/Chest:  Effort normal.  Musculoskeletal: Normal range of motion.  Neurological: He is oriented to person, place, and time.  Skin: Skin is warm and dry.  Psychiatric: He has a normal mood and affect. His behavior is normal.  Vitals reviewed.   RECENT LABS AND TESTS: BMET    Component Value Date/Time   NA 142 11/11/2016 0954   K 4.1 11/11/2016 0954   CL 102 11/11/2016 0954   CO2 23 11/11/2016 0954   GLUCOSE 109 (H) 11/11/2016 0954   BUN 13 11/11/2016 0954   CREATININE 0.86 11/11/2016 0954   CALCIUM 8.8 11/11/2016 0954   GFRNONAA 100 11/11/2016 0954   GFRAA 115 11/11/2016 0954   Lab Results  Component Value Date   HGBA1C 6.4 (H) 11/11/2016   Lab Results  Component Value Date    INSULIN 28.8 (H) 11/11/2016   CBC    Component Value Date/Time   WBC 7.4 11/11/2016 0954   RBC 4.75 11/11/2016 0954   HGB 15.0 11/11/2016 0954   HCT 43.6 11/11/2016 0954   MCV 92 11/11/2016 0954   MCH 31.6 11/11/2016 0954   MCHC 34.4 11/11/2016 0954   RDW 14.4 11/11/2016 0954   LYMPHSABS 2.3 11/11/2016 0954   EOSABS 0.1 11/11/2016 0954   BASOSABS 0.0 11/11/2016 0954   Iron/TIBC/Ferritin/ %Sat No results found for: IRON, TIBC, FERRITIN, IRONPCTSAT Lipid Panel     Component Value Date/Time   CHOL 250 (H) 11/11/2016 0954   TRIG 156 (H) 11/11/2016 0954   HDL 38 (L) 11/11/2016 0954   CHOLHDL 8.3 CALC 03/28/2008 1101   VLDL 22 03/28/2008 1101   LDLCALC 181 (H) 11/11/2016 0954   LDLDIRECT 239.3 03/28/2008 1101   Hepatic Function Panel     Component Value Date/Time   PROT 6.5 11/11/2016 0954   ALBUMIN 4.0 11/11/2016 0954   AST 34 11/11/2016 0954   ALT 92 (H) 11/11/2016 0954   ALKPHOS 76 11/11/2016 0954   BILITOT 0.4 11/11/2016 0954   BILIDIR 0.1 03/28/2008 1101      Component Value Date/Time   TSH 3.260 11/11/2016 0954    ASSESSMENT AND PLAN: Prediabetes - Plan: metFORMIN (GLUCOPHAGE) 500 MG tablet  Vitamin D deficiency - Plan: Vitamin D, Ergocalciferol, (DRISDOL) 50000 units CAPS capsule  Class 2 obesity with serious comorbidity and body mass index (BMI) of 37.0 to 37.9 in adult, unspecified obesity type  PLAN:  Vitamin D Deficiency Austin Hernandez was informed that low vitamin D levels contributes to fatigue and are associated with obesity, breast, and colon cancer. He agrees to continue to take prescription Vit D @50 ,000 IU every week, we will refill for 1 month and will follow up for routine testing of vitamin D, at least 2-3 times per year. He was informed of the risk of over-replacement of vitamin D and agrees to not increase his dose unless he discusses this with [place] first. Austin Hernandez agrees to follow up with our clinic in 3 weeks.  Pre-Diabetes Austin Hernandez will continue to  work on weight loss, exercise, and decreasing simple carbohydrates in his diet to help decrease the risk of diabetes. We dicussed metformin including benefits and risks. He was informed that eating too many simple carbohydrates or too many calories at one sitting increases the likelihood of GI side effects. Austin Hernandez agrees to continue metformin for now and a prescription was written today for 1 month refill. Austin Hernandez agreed to follow up with [place] as directed to monitor his progress.  Obesity Austin Hernandez is currently in the action stage of change. As  such, his goal is to continue with weight loss efforts He has agreed to follow the Category 3 plan Bonney Hernandez has been instructed to work up to a goal of 150 minutes of combined cardio and strengthening exercise per week for weight loss and overall health benefits. We discussed the following Behavioral Modification Strategies today: increasing lean protein intake and travel eating strategies  Bonney Hernandez has agreed to follow up with our clinic in 3 weeks. He was informed of the importance of frequent follow up visits to maximize his success with intensive lifestyle modifications for his multiple health conditions.  I, [place] CraneJoanne Murray, am acting as transcriptionist for Quillian Quincearen Audy Dauphine, MD  I have reviewed the above documentation for accuracy and completeness, and I agree with the above. -Quillian Quincearen Kyrielle Urbanski, MD   OBESITY BEHAVIORAL INTERVENTION VISIT  Today's visit was # 6 out of 22.  Starting weight: 297 lbs Starting date: 11/11/16 Today's weight : 281 lbs  Today's date: 02/10/2017 Total lbs lost to date: 16 (Patients must lose 7 lbs in the first 6 months to continue with counseling)   ASK: We discussed the diagnosis of obesity with Austin Hernandez today and Bonney Hernandez agreed to give us permission to discuss obesity behavioral modification therapy today.  ASSESS: Bonney Hernandez has the diagnosis of obesity and his BMI today is 37.2 Austin Hernandez is in the action stage of change    ADVISE: Bonney Hernandez was educated on the multiple health risks of obesity as well as the benefit of weight loss to improve his health. He was advised of the need for long term treatment and the importance of lifestyle modifications.  AGREE: Multiple dietary modification options and treatment options were discussed and  Austin Hernandez agreed to follow the Category 3 plan We discussed the following Behavioral Modification Strategies today: increasing lean protein intake and travel eating strategies

## 2017-03-04 ENCOUNTER — Ambulatory Visit (INDEPENDENT_AMBULATORY_CARE_PROVIDER_SITE_OTHER): Payer: BLUE CROSS/BLUE SHIELD | Admitting: Family Medicine

## 2017-03-04 VITALS — BP 119/77 | HR 63 | Temp 97.6°F | Ht 73.0 in | Wt 285.0 lb

## 2017-03-04 DIAGNOSIS — Z6837 Body mass index (BMI) 37.0-37.9, adult: Secondary | ICD-10-CM | POA: Diagnosis not present

## 2017-03-04 DIAGNOSIS — R7303 Prediabetes: Secondary | ICD-10-CM | POA: Diagnosis not present

## 2017-03-04 DIAGNOSIS — E669 Obesity, unspecified: Secondary | ICD-10-CM

## 2017-03-04 DIAGNOSIS — E559 Vitamin D deficiency, unspecified: Secondary | ICD-10-CM

## 2017-03-04 DIAGNOSIS — IMO0001 Reserved for inherently not codable concepts without codable children: Secondary | ICD-10-CM

## 2017-03-04 MED ORDER — METFORMIN HCL 500 MG PO TABS: 500.0000 mg | ORAL_TABLET | Freq: Every day | ORAL | 0 refills | Status: DC

## 2017-03-04 MED ORDER — VITAMIN D (ERGOCALCIFEROL) 1.25 MG (50000 UNIT) PO CAPS: 50000.0000 [IU] | ORAL_CAPSULE | ORAL | 0 refills | Status: DC

## 2017-03-05 NOTE — Progress Notes (Signed)
Office: [phone]  /  Fax: [phone]   HPI:   Chief Complaint: OBESITY Austin Hernandez is here to discuss his progress with his obesity treatment plan. He is on the Category 3 plan and is following his eating plan approximately 50 % of the time. He states he is exercising 0 minutes 0 times per week. Austin Hernandez has increased traveling in the last month and had to eat out and he has often skipped meals but he is ready to get back on track.  His weight is 285 lb (129.3 kg) today and has gained 4 pounds since his last visit. He has lost 12 lbs since starting treatment with us.  Pre-Diabetes Austin Hernandez has a diagnosis of pre-diabetes based on his elevated Hgb A1c and was informed this puts him at greater risk of developing diabetes. He is stable on metformin and continues to work on diet and exercise to decrease risk of diabetes but he is struggling more with following the plan. Emilio denies nausea or hypoglycemia.  Vitamin D deficiency Austin Hernandez has a diagnosis of vitamin D deficiency. He is currently taking Vit D and denies nausea, vomiting or muscle weakness.  ALLERGIES: No Known Allergies  MEDICATIONS: No current outpatient prescriptions on file prior to visit.   No current facility-administered medications on file prior to visit.     PAST MEDICAL HISTORY: Past Medical History:  Diagnosis Date  . Back pain   . GERD (gastroesophageal reflux disease)   . History of chicken pox   . HLD (hyperlipidemia)   . Joint pain   . OSA (obstructive sleep apnea) 12/11/2016  . Restless leg   . Seasonal allergies   . Sleep apnea     PAST SURGICAL HISTORY: Past Surgical History:  Procedure Laterality Date  . ADENOIDECTOMY  1970  . ANKLE SURGERY  1980s   torn ligaments; bilateral; separate surgeries (4 total), sports    SOCIAL HISTORY: Social History  Substance Use Topics  . Smoking status: Former Smoker    Packs/day: 1.00    Years: 25.00    Types: Cigarettes    Quit date: 07/01/2010  .  Smokeless tobacco: Never Used  . Alcohol use 3.0 - 3.6 oz/week    5 - 6 Glasses of wine per week     Comment: Occasional (weekends)    FAMILY HISTORY: Family History  Problem Relation Age of Onset  . Arthritis Mother   . Hypertension Mother   . Hyperlipidemia Mother   . Squamous cell carcinoma Father   . Cancer Father [id number]       throat cancer (squamous), smoker  . Stroke Maternal Grandmother 50  . Diabetes Maternal Grandmother   . Cancer Paternal Grandfather        lung?  . Coronary artery disease Neg Hx     ROS: Review of Systems  Constitutional: Negative for weight loss.  Gastrointestinal: Negative for nausea and vomiting.  Musculoskeletal:       Negative muscle weakness  Endo/Heme/Allergies:       Negative hypoglycemia    PHYSICAL EXAM: Blood pressure 119/77, pulse 63, temperature 97.6 F (36.4 C), temperature source Oral, height 6\' 1"  (1.854 m), weight 285 lb (129.3 kg), SpO2 96 %. Body mass index is 37.6 kg/m. Physical Exam  Constitutional: He is oriented to person, place, and time. He appears well-developed and well-nourished.  Cardiovascular: Normal rate.   Pulmonary/Chest: Effort normal.  Musculoskeletal: Normal range of motion.  Neurological: He is oriented to person, place, and time.  Skin: Skin is  warm and dry.  Psychiatric: He has a normal mood and affect. His behavior is normal.  Vitals reviewed.   RECENT LABS AND TESTS: BMET    Component Value Date/Time   NA 142 11/11/2016 0954   K 4.1 11/11/2016 0954   CL 102 11/11/2016 0954   CO2 23 11/11/2016 0954   GLUCOSE 109 (H) 11/11/2016 0954   BUN 13 11/11/2016 0954   CREATININE 0.86 11/11/2016 0954   CALCIUM 8.8 11/11/2016 0954   GFRNONAA 100 11/11/2016 0954   GFRAA 115 11/11/2016 0954   Lab Results  Component Value Date   HGBA1C 6.4 (H) 11/11/2016   Lab Results  Component Value Date   INSULIN 28.8 (H) 11/11/2016   CBC    Component Value Date/Time   WBC 7.4 11/11/2016 0954   RBC 4.75  11/11/2016 0954   HGB 15.0 11/11/2016 0954   HCT 43.6 11/11/2016 0954   MCV 92 11/11/2016 0954   MCH 31.6 11/11/2016 0954   MCHC 34.4 11/11/2016 0954   RDW 14.4 11/11/2016 0954   LYMPHSABS 2.3 11/11/2016 0954   EOSABS 0.1 11/11/2016 0954   BASOSABS 0.0 11/11/2016 0954   Iron/TIBC/Ferritin/ %Sat No results found for: IRON, TIBC, FERRITIN, IRONPCTSAT Lipid Panel     Component Value Date/Time   CHOL 250 (H) 11/11/2016 0954   TRIG 156 (H) 11/11/2016 0954   HDL 38 (L) 11/11/2016 0954   CHOLHDL 8.3 CALC 03/28/2008 1101   VLDL 22 03/28/2008 1101   LDLCALC 181 (H) 11/11/2016 0954   LDLDIRECT 239.3 03/28/2008 1101   Hepatic Function Panel     Component Value Date/Time   PROT 6.5 11/11/2016 0954   ALBUMIN 4.0 11/11/2016 0954   AST 34 11/11/2016 0954   ALT 92 (H) 11/11/2016 0954   ALKPHOS 76 11/11/2016 0954   BILITOT 0.4 11/11/2016 0954   BILIDIR 0.1 03/28/2008 1101      Component Value Date/Time   TSH 3.260 11/11/2016 0954    ASSESSMENT AND PLAN: Prediabetes - Plan: metFORMIN (GLUCOPHAGE) 500 MG tablet  Vitamin D deficiency - Plan: Vitamin D, Ergocalciferol, (DRISDOL) 50000 units CAPS capsule  Class 2 obesity with serious comorbidity and body mass index (BMI) of 37.0 to 37.9 in adult, unspecified obesity type  PLAN:  Pre-Diabetes Arthur will continue to work on weight loss, exercise, and decreasing simple carbohydrates in his diet to help decrease the risk of diabetes. We dicussed metformin including benefits and risks. He was informed that eating too many simple carbohydrates or too many calories at one sitting increases the likelihood of GI side effects. Cavon agrees to continue taking metformin for now and a prescription was written today for 1 month refill.   Ura agrees to follow up with our clinic in 3 weeks, we will recheck labs at that time.   Vitamin D Deficiency Jayshon was informed that low vitamin D levels contributes to fatigue and are associated with  obesity, breast, and colon cancer. He agrees to continue taking prescription Vit D @50 ,000 IU every week and will follow up for routine testing of vitamin D, at least 2-3 times per year. He was informed of the risk of over-replacement of vitamin D and agrees to not increase his dose unless he discusses this with [place] first. We will refill Vit D for 1 month and Vannie agrees to follow up with our clinic in 3 weeks, we will recheck labs at that time.  Obesity Shail is currently in the action stage of change. As such, his goal is  to continue with weight loss efforts He has agreed to follow the Category 3 plan Faiz has been instructed to work up to a goal of 150 minutes of combined cardio and strengthening exercise per week for weight loss and overall health benefits. We discussed the following Behavioral Modification Strategies today: increasing lean protein intake, decreasing simple carbohydrates, and decrease eating out   Heraclio has agreed to follow up with our clinic in 3 weeks. He was informed of the importance of frequent follow up visits to maximize his success with intensive lifestyle modifications for his multiple health conditions.  I, Burt Knack, am acting as transcriptionist for Quillian Quince, MD  I have reviewed the above documentation for accuracy and completeness, and I agree with the above. -Quillian Quince, MD     OBESITY BEHAVIORAL INTERVENTION VISIT  Today's visit was # 7 out of 22.  Starting weight: 297 lbs Starting date: 11/11/16 Today's weight: 285 lbs  Today's date: 03/04/17 Total lbs lost to date: 12 (Patients must lose 7 lbs in the first 6 months to continue with counseling)   ASK: We discussed the diagnosis of obesity with Jairo Ben today and Joann agreed to give [place] permission to discuss obesity behavioral modification therapy today.  ASSESS: Maijor has the diagnosis of obesity and his BMI today is 35 Dhyan is in the action stage of change    ADVISE: Branson was educated on the multiple health risks of obesity as well as the benefit of weight loss to improve his health. He was advised of the need for long term treatment and the importance of lifestyle modifications.  AGREE: Multiple dietary modification options and treatment options were discussed and  Shakil agreed to follow the Category 3 plan We discussed the following Behavioral Modification Strategies today: increasing lean protein intake, decreasing simple carbohydrates, and decrease eating out

## 2017-03-24 ENCOUNTER — Ambulatory Visit (INDEPENDENT_AMBULATORY_CARE_PROVIDER_SITE_OTHER): Payer: BLUE CROSS/BLUE SHIELD | Admitting: Family Medicine

## 2017-03-24 VITALS — BP 119/80 | Temp 97.7°F | Ht 73.0 in | Wt 283.0 lb

## 2017-03-24 DIAGNOSIS — E784 Other hyperlipidemia: Secondary | ICD-10-CM

## 2017-03-24 DIAGNOSIS — Z6837 Body mass index (BMI) 37.0-37.9, adult: Secondary | ICD-10-CM | POA: Diagnosis not present

## 2017-03-24 DIAGNOSIS — E559 Vitamin D deficiency, unspecified: Secondary | ICD-10-CM | POA: Diagnosis not present

## 2017-03-24 DIAGNOSIS — E7849 Other hyperlipidemia: Secondary | ICD-10-CM

## 2017-03-24 DIAGNOSIS — E669 Obesity, unspecified: Secondary | ICD-10-CM

## 2017-03-24 DIAGNOSIS — R7303 Prediabetes: Secondary | ICD-10-CM | POA: Diagnosis not present

## 2017-03-24 DIAGNOSIS — IMO0001 Reserved for inherently not codable concepts without codable children: Secondary | ICD-10-CM

## 2017-03-24 NOTE — Progress Notes (Signed)
Office: [phone]  /  Fax: [phone]   HPI:   Chief Complaint: OBESITY Austin Hernandez is here to discuss his progress with his obesity treatment plan. He is on the Category 3 plan and is following his eating plan approximately 50 % of the time. He states he is exercising 0 minutes 0 times per week. Austin Hernandez continues to do well with weight loss. He is not following his plan as closely due to increased traveling. His weight is 283 lb (128.4 kg) today and has had a weight loss of 2 pounds over a period of 3 weeks since his last visit. He has lost 14 lbs since starting treatment with [place].  Vitamin D deficiency Austin Hernandez has a diagnosis of vitamin D deficiency. He is currently taking vit D and is due for labs. He denies nausea, vomiting or muscle weakness.  Pre-Diabetes Austin Hernandez has a diagnosis of pre-diabetes based on his elevated Hgb A1c and was informed this puts him at greater risk of developing diabetes. He is taking metformin currently and continues to work on diet and exercise to decrease risk of diabetes. He denies nausea or hypoglycemia.  Hyperlipidemia Austin Hernandez has hyperlipidemia and is not on a statin. He is attempting to control his cholesterol levels with intensive lifestyle modification including a low saturated fat diet, exercise and weight loss. He denies any chest pain, claudication or myalgias.  ALLERGIES: No Known Allergies  MEDICATIONS: Current Outpatient Prescriptions on File Prior to Visit  Medication Sig Dispense Refill  . metFORMIN (GLUCOPHAGE) 500 MG tablet Take 1 tablet (500 mg total) by mouth daily with breakfast. 30 tablet 0  . Vitamin D, Ergocalciferol, (DRISDOL) 50000 units CAPS capsule Take 1 capsule (50,000 Units total) by mouth every 7 (seven) days. 4 capsule 0   No current facility-administered medications on file prior to visit.     PAST MEDICAL HISTORY: Past Medical History:  Diagnosis Date  . Back pain   . GERD (gastroesophageal reflux disease)   .  History of chicken pox   . HLD (hyperlipidemia)   . Joint pain   . OSA (obstructive sleep apnea) 12/11/2016  . Restless leg   . Seasonal allergies   . Sleep apnea     PAST SURGICAL HISTORY: Past Surgical History:  Procedure Laterality Date  . ADENOIDECTOMY  1970  . ANKLE SURGERY  1980s   torn ligaments; bilateral; separate surgeries (4 total), sports    SOCIAL HISTORY: Social History  Substance Use Topics  . Smoking status: Former Smoker    Packs/day: 1.00    Years: 25.00    Types: Cigarettes    Quit date: 07/01/2010  . Smokeless tobacco: Never Used  . Alcohol use 3.0 - 3.6 oz/week    5 - 6 Glasses of wine per week     Comment: Occasional (weekends)    FAMILY HISTORY: Family History  Problem Relation Age of Onset  . Arthritis Mother   . Hypertension Mother   . Hyperlipidemia Mother   . Squamous cell carcinoma Father   . Cancer Father 51       throat cancer (squamous), smoker  . Stroke Maternal Grandmother 50  . Diabetes Maternal Grandmother   . Cancer Paternal Grandfather        lung?  . Coronary artery disease Neg Hx     ROS: Review of Systems  Constitutional: Positive for weight loss.  Cardiovascular: Negative for chest pain and claudication.  Gastrointestinal: Negative for nausea and vomiting.  Musculoskeletal: Negative for myalgias.  Negative muscle weakness    PHYSICAL EXAM: Blood pressure 119/80, temperature 97.7 F (36.5 C), temperature source Oral, height  (1.854 m), weight 283 lb (128.4 kg), SpO2 98 %. Body mass index is 37.34 kg/m. Physical Exam  Constitutional: He is oriented to person, place, and time. He appears well-developed and well-nourished.  Cardiovascular: Normal rate.   Pulmonary/Chest: Effort normal.  Musculoskeletal: Normal range of motion.  Neurological: He is oriented to person, place, and time.  Skin: Skin is warm and dry.  Psychiatric: He has a normal mood and affect. His behavior is normal.  Vitals  reviewed.   RECENT LABS AND TESTS: BMET    Component Value Date/Time   NA 142 11/11/2016 0954   K 4.1 11/11/2016 0954   CL 102 11/11/2016 0954   CO2 23 11/11/2016 0954   GLUCOSE 109 (H) 11/11/2016 0954   BUN 13 11/11/2016 0954   CREATININE 0.86 11/11/2016 0954   CALCIUM 8.8 11/11/2016 0954   GFRNONAA 100 11/11/2016 0954   GFRAA 115 11/11/2016 0954   Lab Results  Component Value Date   HGBA1C 6.4 (H) 11/11/2016   Lab Results  Component Value Date   INSULIN 28.8 (H) 11/11/2016   CBC    Component Value Date/Time   WBC 7.4 11/11/2016 0954   RBC 4.75 11/11/2016 0954   HGB 15.0 11/11/2016 0954   HCT 43.6 11/11/2016 0954   MCV 92 11/11/2016 0954   MCH 31.6 11/11/2016 0954   MCHC 34.4 11/11/2016 0954   RDW 14.4 11/11/2016 0954   LYMPHSABS 2.3 11/11/2016 0954   EOSABS 0.1 11/11/2016 0954   BASOSABS 0.0 11/11/2016 0954   Iron/TIBC/Ferritin/ %Sat No results found for: IRON, TIBC, FERRITIN, IRONPCTSAT Lipid Panel     Component Value Date/Time   CHOL 250 (H) 11/11/2016 0954   TRIG 156 (H) 11/11/2016 0954   HDL 38 (L) 11/11/2016 0954   CHOLHDL 8.3 CALC 03/28/2008 1101   VLDL 22 03/28/2008 1101   LDLCALC 181 (H) 11/11/2016 0954   LDLDIRECT 239.3 03/28/2008 1101   Hepatic Function Panel     Component Value Date/Time   PROT 6.5 11/11/2016 0954   ALBUMIN 4.0 11/11/2016 0954   AST 34 11/11/2016 0954   ALT 92 (H) 11/11/2016 0954   ALKPHOS 76 11/11/2016 0954   BILITOT 0.4 11/11/2016 0954   BILIDIR 0.1 03/28/2008 1101      Component Value Date/Time   TSH 3.260 11/11/2016 0954    ASSESSMENT AND PLAN: Prediabetes - Plan: Vitamin B12, CBC With Differential, Comprehensive metabolic panel, Hemoglobin A1c, Insulin, random, Folate  Vitamin D deficiency - Plan: VITAMIN D 25 Hydroxy (Vit-D Deficiency, Fractures)  Other hyperlipidemia - Plan: Lipid Panel With LDL/HDL Ratio  Class 2 obesity with serious comorbidity and body mass index (BMI) of 37.0 to 37.9 in adult,  unspecified obesity type  PLAN:  Vitamin D Deficiency Austin Hernandez was informed that low vitamin D levels contributes to fatigue and are associated with obesity, breast, and colon cancer. He agrees to continue to take prescription Vit D ,000 IU every week and we will check labs and will follow up for routine testing of vitamin D, at least 2-3 times per year. He was informed of the risk of over-replacement of vitamin D and agrees to not increase his dose unless he discusses this with [place] first.  Pre-Diabetes Elick will continue to work on weight loss, exercise, and decreasing simple carbohydrates in his diet to help decrease the risk of diabetes. We dicussed metformin including benefits  and risks. He was informed that eating too many simple carbohydrates or too many calories at one sitting increases the likelihood of GI side effects. We will check labs and Ryman agrees to continue metformin for now and a prescription was not written today. Ellen agreed to follow up with [place] as directed to monitor his progress.  Hyperlipidemia Diyari was informed of the American Heart Association Guidelines emphasizing intensive lifestyle modifications as the first line treatment for hyperlipidemia. We discussed many lifestyle modifications today in depth, and Michaeal will continue to work on decreasing saturated fats such as fatty red meat, butter and many fried foods. He will also increase vegetables and lean protein in his diet and continue to work on exercise and weight loss efforts. We will check labs and Primitivo agrees to follow up with our clinic in 3 weeks.   Obesity Kaycee is currently in the action stage of change. As such, his goal is to continue with weight loss efforts He has agreed to follow the Category 3 plan Vanderbilt has been instructed to work up to a goal of 150 minutes of combined cardio and strengthening exercise per week for weight loss and overall health benefits. We discussed the following  Behavioral Modification Strategies today: increasing lean protein intake, decreasing simple carbohydrates  and work on meal planning and easy cooking plans  Naif has agreed to follow up with our clinic in 3 weeks. He was informed of the importance of frequent follow up visits to maximize his success with intensive lifestyle modifications for his multiple health conditions.  I, [place] Crane, am acting as transcriptionist for Quillian Quince, MD  I have reviewed the above documentation for accuracy and completeness, and I agree with the above. -Quillian Quince, MD    OBESITY BEHAVIORAL INTERVENTION VISIT  Today's visit was # 8 out of 22.  Starting weight: 297 lbs Starting date: 11/11/16 Today's weight : 283 lbs Today's date: 03/24/2017 Total lbs lost to date: 14 (Patients must lose 7 lbs in the first 6 months to continue with counseling)   ASK: We discussed the diagnosis of obesity with Jairo Ben today and Sandon agreed to give [place] permission to discuss obesity behavioral modification therapy today.  ASSESS: Bates has the diagnosis of obesity and his BMI today is 37.35 Deandrae is in the action stage of change   ADVISE: Maahir was educated on the multiple health risks of obesity as well as the benefit of weight loss to improve his health. He was advised of the need for long term treatment and the importance of lifestyle modifications.  AGREE: Multiple dietary modification options and treatment options were discussed and  Cuong agreed to follow the Category 3 plan We discussed the following Behavioral Modification Strategies today: increasing lean protein intake, decreasing simple carbohydrates  and work on meal planning and easy cooking plans

## 2017-03-25 LAB — CBC WITH DIFFERENTIAL
BASOS: 1 %
Basophils Absolute: 0 10*3/uL (ref 0.0–0.2)
EOS (ABSOLUTE): 0.1 10*3/uL (ref 0.0–0.4)
Eos: 1 %
HEMOGLOBIN: 15.2 g/dL (ref 13.0–17.7)
Hematocrit: 45.1 % (ref 37.5–51.0)
Immature Grans (Abs): 0 10*3/uL (ref 0.0–0.1)
Immature Granulocytes: 0 %
Lymphocytes Absolute: 2.6 10*3/uL (ref 0.7–3.1)
Lymphs: 30 %
MCH: 31 pg (ref 26.6–33.0)
MCHC: 33.7 g/dL (ref 31.5–35.7)
MCV: 92 fL (ref 79–97)
MONOCYTES: 7 %
Monocytes Absolute: 0.6 10*3/uL (ref 0.1–0.9)
NEUTROS ABS: 5.2 10*3/uL (ref 1.4–7.0)
Neutrophils: 61 %
RBC: 4.9 x10E6/uL (ref 4.14–5.80)
RDW: 14.8 % (ref 12.3–15.4)
WBC: 8.5 10*3/uL (ref 3.4–10.8)

## 2017-03-25 LAB — LIPID PANEL WITH LDL/HDL RATIO
Cholesterol, Total: 225 mg/dL — ABNORMAL HIGH (ref 100–199)
HDL: 41 mg/dL (ref >39)
LDL Calculated: 154 mg/dL — ABNORMAL HIGH (ref 0–99)
LDL/HDL RATIO: 3.8 ratio — AB (ref 0.0–3.6)
Triglycerides: 149 mg/dL (ref 0–149)
VLDL Cholesterol Cal: 30 mg/dL (ref 5–40)

## 2017-03-25 LAB — COMPREHENSIVE METABOLIC PANEL
A/G RATIO: 1.8 (ref 1.2–2.2)
ALBUMIN: 4.2 g/dL (ref 3.5–5.5)
ALK PHOS: 71 IU/L (ref 39–117)
ALT: 68 IU/L — ABNORMAL HIGH (ref 0–44)
AST: 30 IU/L (ref 0–40)
BILIRUBIN TOTAL: 0.4 mg/dL (ref 0.0–1.2)
BUN/Creatinine Ratio: 14 (ref 9–20)
BUN: 12 mg/dL (ref 6–24)
CO2: 24 mmol/L (ref 20–29)
Calcium: 8.8 mg/dL (ref 8.7–10.2)
Chloride: 100 mmol/L (ref 96–106)
Creatinine, Ser: 0.88 mg/dL (ref 0.76–1.27)
GFR calc Af Amer: 113 mL/min/{1.73_m2} (ref >59)
GFR calc non Af Amer: 98 mL/min/{1.73_m2} (ref >59)
GLOBULIN, TOTAL: 2.3 g/dL (ref 1.5–4.5)
Glucose: 87 mg/dL (ref 65–99)
POTASSIUM: 4.3 mmol/L (ref 3.5–5.2)
SODIUM: 140 mmol/L (ref 134–144)
TOTAL PROTEIN: 6.5 g/dL (ref 6.0–8.5)

## 2017-03-25 LAB — HEMOGLOBIN A1C
ESTIMATED AVERAGE GLUCOSE: 123 mg/dL
HEMOGLOBIN A1C: 5.9 % — AB (ref 4.8–5.6)

## 2017-03-25 LAB — FOLATE: Folate: 9.2 ng/mL (ref >3.0)

## 2017-03-25 LAB — VITAMIN B12: Vitamin B-12: 434 pg/mL (ref 232–1245)

## 2017-03-25 LAB — VITAMIN D 25 HYDROXY (VIT D DEFICIENCY, FRACTURES): VIT D 25 HYDROXY: 41.3 ng/mL (ref 30.0–100.0)

## 2017-03-25 LAB — INSULIN, RANDOM: INSULIN: 18 u[IU]/mL (ref 2.6–24.9)

## 2017-04-16 ENCOUNTER — Ambulatory Visit (INDEPENDENT_AMBULATORY_CARE_PROVIDER_SITE_OTHER): Payer: BLUE CROSS/BLUE SHIELD | Admitting: Physician Assistant

## 2017-04-16 VITALS — BP 115/75 | HR 65 | Temp 97.3°F | Ht 73.0 in | Wt 280.0 lb

## 2017-04-16 DIAGNOSIS — E559 Vitamin D deficiency, unspecified: Secondary | ICD-10-CM

## 2017-04-16 DIAGNOSIS — R7303 Prediabetes: Secondary | ICD-10-CM

## 2017-04-16 DIAGNOSIS — Z6837 Body mass index (BMI) 37.0-37.9, adult: Secondary | ICD-10-CM

## 2017-04-16 MED ORDER — VITAMIN D (ERGOCALCIFEROL) 1.25 MG (50000 UNIT) PO CAPS: 50000.0000 [IU] | ORAL_CAPSULE | ORAL | 0 refills | Status: DC

## 2017-04-17 NOTE — Progress Notes (Signed)
Office: [phone]  /  Fax: [phone]   HPI:   Chief Complaint: OBESITY Austin Hernandez is here to discuss his progress with his obesity treatment plan. He is on the Category 3 plan and is following his eating plan approximately 60 % of the time. He states he is exercising walking 45 minutes 1 times per week. Austin Hernandez was on vacation but managed to control his portions and make smarter food choices. He would like more meal planning ideas.  His weight is 280 lb (127 kg) today and has had a weight loss of 3 pounds over a period of 3 weeks since his last visit. He has lost 17 lbs since starting treatment with us.  Vitamin D deficiency Austin Hernandez has a diagnosis of vitamin D deficiency. He is currently taking prescription Vit D and denies nausea, vomiting or muscle weakness.  Pre-Diabetes Austin Hernandez has a diagnosis of pre-diabetes based on his elevated Hgb A1c and was informed this puts him at greater risk of developing diabetes. His A1c has improved to 5.4. He is taking metformin currently and continues to work on diet and exercise to decrease risk of diabetes. He denies nausea or hypoglycemia.  ALLERGIES: No Known Allergies  MEDICATIONS: Current Outpatient Prescriptions on File Prior to Visit  Medication Sig Dispense Refill  . metFORMIN (GLUCOPHAGE) 500 MG tablet Take 1 tablet (500 mg total) by mouth daily with breakfast. 30 tablet 0   No current facility-administered medications on file prior to visit.     PAST MEDICAL HISTORY: Past Medical History:  Diagnosis Date  . Back pain   . GERD (gastroesophageal reflux disease)   . History of chicken pox   . HLD (hyperlipidemia)   . Joint pain   . OSA (obstructive sleep apnea) 12/11/2016  . Restless leg   . Seasonal allergies   . Sleep apnea     PAST SURGICAL HISTORY: Past Surgical History:  Procedure Laterality Date  . ADENOIDECTOMY  1970  . ANKLE SURGERY  1980s   torn ligaments; bilateral; separate surgeries (4 total), sports     SOCIAL HISTORY: Social History  Substance Use Topics  . Smoking status: Former Smoker    Packs/day: 1.00    Years: 25.00    Types: Cigarettes    Quit date: 07/01/2010  . Smokeless tobacco: Never Used  . Alcohol use 3.0 - 3.6 oz/week    5 - 6 Glasses of wine per week     Comment: Occasional (weekends)    FAMILY HISTORY: Family History  Problem Relation Age of Onset  . Arthritis Mother   . Hypertension Mother   . Hyperlipidemia Mother   . Squamous cell carcinoma Father   . Cancer Father [id number]       throat cancer (squamous), smoker  . Stroke Maternal Grandmother 50  . Diabetes Maternal Grandmother   . Cancer Paternal Grandfather        lung?  . Coronary artery disease Neg Hx     ROS: Review of Systems  Constitutional: Positive for weight loss.  Gastrointestinal: Negative for nausea and vomiting.  Musculoskeletal:       Negative muscle weakness  Endo/Heme/Allergies:       Negative hypoglycemia    PHYSICAL EXAM: Blood pressure 115/75, pulse 65, temperature (!) 97.3 F (36.3 C), temperature source Oral, height 6\' 1"  (1.854 m), weight 280 lb (127 kg), SpO2 98 %. Body mass index is 36.94 kg/m. Physical Exam  Constitutional: He is oriented to person, place, and time. He appears well-developed and well-nourished.  Cardiovascular: Normal rate.   Pulmonary/Chest: Effort normal.  Musculoskeletal: Normal range of motion.  Neurological: He is oriented to person, place, and time.  Skin: Skin is warm and dry.  Psychiatric: He has a normal mood and affect. His behavior is normal.  Vitals reviewed.   RECENT LABS AND TESTS: BMET    Component Value Date/Time   NA 140 03/24/2017 1215   K 4.3 03/24/2017 1215   CL 100 03/24/2017 1215   CO2 24 03/24/2017 1215   GLUCOSE 87 03/24/2017 1215   BUN 12 03/24/2017 1215   CREATININE 0.88 03/24/2017 1215   CALCIUM 8.8 03/24/2017 1215   GFRNONAA 98 03/24/2017 1215   GFRAA 113 03/24/2017 1215   Lab Results  Component Value Date    HGBA1C 5.9 (H) 03/24/2017   HGBA1C 6.4 (H) 11/11/2016   Lab Results  Component Value Date   INSULIN 18.0 03/24/2017   INSULIN 28.8 (H) 11/11/2016   CBC    Component Value Date/Time   WBC 8.5 03/24/2017 1215   RBC 4.90 03/24/2017 1215   HGB 15.2 03/24/2017 1215   HCT 45.1 03/24/2017 1215   MCV 92 03/24/2017 1215   MCH 31.0 03/24/2017 1215   MCHC 33.7 03/24/2017 1215   RDW 14.8 03/24/2017 1215   LYMPHSABS 2.6 03/24/2017 1215   EOSABS 0.1 03/24/2017 1215   BASOSABS 0.0 03/24/2017 1215   Iron/TIBC/Ferritin/ %Sat No results found for: IRON, TIBC, FERRITIN, IRONPCTSAT Lipid Panel     Component Value Date/Time   CHOL 225 (H) 03/24/2017 1215   TRIG 149 03/24/2017 1215   HDL 41 03/24/2017 1215   CHOLHDL 8.3 CALC 03/28/2008 1101   VLDL 22 03/28/2008 1101   LDLCALC 154 (H) 03/24/2017 1215   LDLDIRECT 239.3 03/28/2008 1101   Hepatic Function Panel     Component Value Date/Time   PROT 6.5 03/24/2017 1215   ALBUMIN 4.2 03/24/2017 1215   AST 30 03/24/2017 1215   ALT 68 (H) 03/24/2017 1215   ALKPHOS 71 03/24/2017 1215   BILITOT 0.4 03/24/2017 1215   BILIDIR 0.1 03/28/2008 1101      Component Value Date/Time   TSH 3.260 11/11/2016 0954    ASSESSMENT AND PLAN: Vitamin D deficiency - Plan: Vitamin D, Ergocalciferol, (DRISDOL) 50000 units CAPS capsule  Prediabetes  Class 2 severe obesity with serious comorbidity and body mass index (BMI) of 37.0 to 37.9 in adult, unspecified obesity type (HCC)  PLAN:  Vitamin D Deficiency Dung was informed that low vitamin D levels contributes to fatigue and are associated with obesity, breast, and colon cancer. He agrees to continue taking prescription Vit D @50 ,000 IU every week # 4 and we will refill for 1 month. He will follow up for routine testing of vitamin D, at least 2-3 times per year. He was informed of the risk of over-replacement of vitamin D and agrees to not increase his dose unless he discusses this with [place] first.  Amillion agrees to follow up with our clinic in 3 weeks.  Pre-Diabetes Itzae will continue to work on weight loss, exercise, and decreasing simple carbohydrates in his diet to help decrease the risk of diabetes. We dicussed metformin including benefits and risks. He was informed that eating too many simple carbohydrates or too many calories at one sitting increases the likelihood of GI side effects. Liberato agrees to continue taking metformin as prescribed. Francois agrees to follow up with our clinic in 3 weeks as directed to monitor his progress.  Obesity Gavynn is currently in  the action stage of change. As such, his goal is to continue with weight loss efforts He has agreed to follow the Category 3 plan Tyreik has been instructed to work up to a goal of 150 minutes of combined cardio and strengthening exercise per week for weight loss and overall health benefits. We discussed the following Behavioral Modification Strategies today: increasing lean protein intake and work on meal planning and easy cooking plans   Ariel has agreed to follow up with our clinic in 3 weeks. He was informed of the importance of frequent follow up visits to maximize his success with intensive lifestyle modifications for his multiple health conditions.  I, Burt Knack, am acting as transcriptionist for Illa Level, PA-C  I have reviewed the above documentation for accuracy and completeness, and I agree with the above. -Illa Level, PA-C  I have reviewed the above note and agree with the plan. -Quillian Quince, MD     Today's visit was # 9 out of 22.  Starting weight: 297 lbs Starting date: 11/11/16 Today's weight : 280 lbs Today's date: 04/16/2017 Total lbs lost to date: 17 (Patients must lose 7 lbs in the first 6 months to continue with counseling)   ASK: We discussed the diagnosis of obesity with Jairo Ben today and Timmey agreed to give [place] permission to discuss obesity behavioral  modification therapy today.  ASSESS: Naven has the diagnosis of obesity and his BMI today is 31 Hervey is in the action stage of change   ADVISE: Damarco was educated on the multiple health risks of obesity as well as the benefit of weight loss to improve his health. He was advised of the need for long term treatment and the importance of lifestyle modifications.  AGREE: Multiple dietary modification options and treatment options were discussed and  Dejion agreed to follow the Category 3 plan We discussed the following Behavioral Modification Strategies today: increasing lean protein intake and work on meal planning and easy cooking plans

## 2017-04-29 ENCOUNTER — Other Ambulatory Visit (INDEPENDENT_AMBULATORY_CARE_PROVIDER_SITE_OTHER): Payer: Self-pay | Admitting: Family Medicine

## 2017-04-29 DIAGNOSIS — R7303 Prediabetes: Secondary | ICD-10-CM

## 2017-05-06 ENCOUNTER — Encounter (INDEPENDENT_AMBULATORY_CARE_PROVIDER_SITE_OTHER): Payer: Self-pay

## 2017-05-06 ENCOUNTER — Ambulatory Visit (INDEPENDENT_AMBULATORY_CARE_PROVIDER_SITE_OTHER): Payer: BLUE CROSS/BLUE SHIELD | Admitting: Physician Assistant

## 2017-05-06 VITALS — BP 121/79 | HR 70 | Temp 97.9°F | Ht 73.0 in | Wt 280.0 lb

## 2017-05-06 DIAGNOSIS — E559 Vitamin D deficiency, unspecified: Secondary | ICD-10-CM | POA: Diagnosis not present

## 2017-05-06 DIAGNOSIS — Z6837 Body mass index (BMI) 37.0-37.9, adult: Secondary | ICD-10-CM

## 2017-05-06 DIAGNOSIS — R7303 Prediabetes: Secondary | ICD-10-CM | POA: Diagnosis not present

## 2017-05-06 NOTE — Progress Notes (Signed)
Office: [phone]  /  Fax: [phone]   HPI:   Chief Complaint: OBESITY Bonney RousselCarsten is here to discuss his progress with his obesity treatment plan. He is on the Category 3 plan and is following his eating plan approximately 55 % of the time. He states he is exercising 0 minutes 0 times per week. Klark maintained his weight. He continues to plan his meals well and states hunger is well controlled. Bonney RousselCarsten will be [place] to [place] and would like travel eating strategies. His weight is 280 lb (127 kg) today and has maintained weight over a period of 3 weeks since his last visit. He has lost 17 lbs since starting treatment with us.  Pre-Diabetes Bonney RousselCarsten has a diagnosis of pre-diabetes based on his elevated Hgb A1c and was informed this puts him at greater risk of developing diabetes. He is taking metformin currently and continues to work on diet and exercise to decrease risk of diabetes. He denies nausea, polyphagia or hypoglycemia.  Vitamin D deficiency Bonney RousselCarsten has a diagnosis of vitamin D deficiency. He is currently taking vit D and denies nausea, vomiting or muscle weakness.  ALLERGIES: No Known Allergies  MEDICATIONS: Current Outpatient Medications on File Prior to Visit  Medication Sig Dispense Refill  . metFORMIN (GLUCOPHAGE) 500 MG tablet Take 1 tablet (500 mg total) by mouth daily with breakfast. 30 tablet 0  . Vitamin D, Ergocalciferol, (DRISDOL) 50000 units CAPS capsule Take 1 capsule (50,000 Units total) by mouth every 7 (seven) days. 4 capsule 0   No current facility-administered medications on file prior to visit.     PAST MEDICAL HISTORY: Past Medical History:  Diagnosis Date  . Back pain   . GERD (gastroesophageal reflux disease)   . History of chicken pox   . HLD (hyperlipidemia)   . Joint pain   . OSA (obstructive sleep apnea) 12/11/2016  . Restless leg   . Seasonal allergies   . Sleep apnea     PAST SURGICAL HISTORY: Past Surgical History:    Procedure Laterality Date  . ADENOIDECTOMY  1970  . ANKLE SURGERY  1980s   torn ligaments; bilateral; separate surgeries (4 total), sports    SOCIAL HISTORY: Social History   Tobacco Use  . Smoking status: Former Smoker    Packs/day: 1.00    Years: 25.00    Pack years: 25.00    Types: Cigarettes    Last attempt to quit: 07/01/2010    Years since quitting: 6.8  . Smokeless tobacco: Never Used  Substance Use Topics  . Alcohol use: Yes    Alcohol/week: 3.0 - 3.6 oz    Types: 5 - 6 Glasses of wine per week    Comment: Occasional (weekends)  . Drug use: No    FAMILY HISTORY: Family History  Problem Relation Age of Onset  . Arthritis Mother   . Hypertension Mother   . Hyperlipidemia Mother   . Squamous cell carcinoma Father   . Cancer Father [id number]       throat cancer (squamous), smoker  . Stroke Maternal Grandmother 50  . Diabetes Maternal Grandmother   . Cancer Paternal Grandfather        lung?  . Coronary artery disease Neg Hx     ROS: Review of Systems  Constitutional: Negative for weight loss.  Gastrointestinal: Negative for nausea and vomiting.  Musculoskeletal:       Negative muscle weakness  Endo/Heme/Allergies:       Negative polyphagia Negative hypoglycemia    PHYSICAL  EXAM: Blood pressure 121/79, pulse 70, temperature 97.9 F (36.6 C), temperature source Oral, height 6\' 1"  (1.854 m), weight 280 lb (127 kg). Body mass index is 36.94 kg/m. Physical Exam  Constitutional: He is oriented to person, place, and time. He appears well-developed and well-nourished.  Cardiovascular: Normal rate.  Pulmonary/Chest: Effort normal.  Musculoskeletal: Normal range of motion.  Neurological: He is oriented to person, place, and time.  Skin: Skin is warm and dry.  Psychiatric: He has a normal mood and affect. His behavior is normal.  Vitals reviewed.   RECENT LABS AND TESTS: BMET    Component Value Date/Time   NA 140 03/24/2017 1215   K 4.3 03/24/2017 1215   CL  100 03/24/2017 1215   CO2 24 03/24/2017 1215   GLUCOSE 87 03/24/2017 1215   BUN 12 03/24/2017 1215   CREATININE 0.88 03/24/2017 1215   CALCIUM 8.8 03/24/2017 1215   GFRNONAA 98 03/24/2017 1215   GFRAA 113 03/24/2017 1215   Lab Results  Component Value Date   HGBA1C 5.9 (H) 03/24/2017   HGBA1C 6.4 (H) 11/11/2016   Lab Results  Component Value Date   INSULIN 18.0 03/24/2017   INSULIN 28.8 (H) 11/11/2016   CBC    Component Value Date/Time   WBC 8.5 03/24/2017 1215   RBC 4.90 03/24/2017 1215   HGB 15.2 03/24/2017 1215   HCT 45.1 03/24/2017 1215   MCV 92 03/24/2017 1215   MCH 31.0 03/24/2017 1215   MCHC 33.7 03/24/2017 1215   RDW 14.8 03/24/2017 1215   LYMPHSABS 2.6 03/24/2017 1215   EOSABS 0.1 03/24/2017 1215   BASOSABS 0.0 03/24/2017 1215   Iron/TIBC/Ferritin/ %Sat No results found for: IRON, TIBC, FERRITIN, IRONPCTSAT Lipid Panel     Component Value Date/Time   CHOL 225 (H) 03/24/2017 1215   TRIG 149 03/24/2017 1215   HDL 41 03/24/2017 1215   CHOLHDL 8.3 CALC 03/28/2008 1101   VLDL 22 03/28/2008 1101   LDLCALC 154 (H) 03/24/2017 1215   LDLDIRECT 239.3 03/28/2008 1101   Hepatic Function Panel     Component Value Date/Time   PROT 6.5 03/24/2017 1215   ALBUMIN 4.2 03/24/2017 1215   AST 30 03/24/2017 1215   ALT 68 (H) 03/24/2017 1215   ALKPHOS 71 03/24/2017 1215   BILITOT 0.4 03/24/2017 1215   BILIDIR 0.1 03/28/2008 1101      Component Value Date/Time   TSH 3.260 11/11/2016 0954    ASSESSMENT AND PLAN: Prediabetes  Vitamin D deficiency  Class 2 severe obesity with serious comorbidity and body mass index (BMI) of 37.0 to 37.9 in adult, unspecified obesity type (HCC)  PLAN:  Pre-Diabetes Render will continue to work on weight loss, exercise, and decreasing simple carbohydrates in his diet to help decrease the risk of diabetes. We dicussed metformin including benefits and risks. He was informed that eating too many simple carbohydrates or too many  calories at one sitting increases the likelihood of GI side effects. Burhan requested metformin for now and a prescription was written today for metformin 500 mg #60 and Keylan agreed to follow up with [place] as directed to monitor his progress.  Vitamin D Deficiency Rhydian was informed that low vitamin D levels contributes to fatigue and are associated with obesity, breast, and colon cancer. He agrees to continue to take prescription Vit D @50 ,000 IU every week #8 with no refills and will follow up for routine testing of vitamin D, at least 2-3 times per year. He was informed of  the risk of over-replacement of vitamin D and agrees to not increase his dose unless he discusses this with us first. Bonney RousselCarsten agrees to follow up with our clinic in 9 weeks.  Obesity Bonney RousselCarsten is currently in the action stage of change. As such, his goal is to continue with weight loss efforts He has agreed to keep a food journal with 600 calories and 40 grams of protein at supper daily and follow the Category 3 plan Bonney RousselCarsten has been instructed to work up to a goal of 150 minutes of combined cardio and strengthening exercise per week for weight loss and overall health benefits. We discussed the following Behavioral Modification Strategies today: increasing lean protein intake and travel eating strategies  Bonney RousselCarsten has agreed to follow up with our clinic in 9 weeks. He was informed of the importance of frequent follow up visits to maximize his success with intensive lifestyle modifications for his multiple health conditions.  I, [place] CraneJoanne Murray, am acting as transcriptionist for Illa LevelSahar Tineshia Becraft, PA-C  I have reviewed the above documentation for accuracy and completeness, and I agree with the above. -Illa LevelSahar Zniyah Midkiff, PA-C  I have reviewed the above note and agree with the plan. -Quillian Quincearen Beasley, MD   OBESITY BEHAVIORAL INTERVENTION VISIT  Today's visit was # 10 out of 22.  Starting weight: 297 lbs Starting date: 11/11/16 Today's  weight : 280 lbs  Today's date: 05/06/2017 Total lbs lost to date: 17 (Patients must lose 7 lbs in the first 6 months to continue with counseling)   ASK: We discussed the diagnosis of obesity with Jairo Benarsten Jelinek today and Bonney RousselCarsten agreed to give us permission to discuss obesity behavioral modification therapy today.  ASSESS: Bonney RousselCarsten has the diagnosis of obesity and his BMI today is 36.95 Bonney RousselCarsten is in the action stage of change   ADVISE: Bonney RousselCarsten was educated on the multiple health risks of obesity as well as the benefit of weight loss to improve his health. He was advised of the need for long term treatment and the importance of lifestyle modifications.  AGREE: Multiple dietary modification options and treatment options were discussed and  Jonthan agreed to keep a food journal with 600 calories and 40 grams of protein at supper daily and follow the Category 3 plan We discussed the following Behavioral Modification Strategies today: increasing lean protein intake and travel eating strategies

## 2017-05-07 MED ORDER — VITAMIN D (ERGOCALCIFEROL) 1.25 MG (50000 UNIT) PO CAPS: 50000.0000 [IU] | ORAL_CAPSULE | ORAL | 0 refills | Status: DC

## 2017-05-07 MED ORDER — METFORMIN HCL 500 MG PO TABS: 500.0000 mg | ORAL_TABLET | Freq: Two times a day (BID) | ORAL | 0 refills | Status: DC

## 2017-07-06 ENCOUNTER — Encounter (INDEPENDENT_AMBULATORY_CARE_PROVIDER_SITE_OTHER): Payer: Self-pay | Admitting: Physician Assistant

## 2017-07-07 ENCOUNTER — Ambulatory Visit (INDEPENDENT_AMBULATORY_CARE_PROVIDER_SITE_OTHER): Payer: BLUE CROSS/BLUE SHIELD | Admitting: Physician Assistant

## 2017-07-07 ENCOUNTER — Encounter (INDEPENDENT_AMBULATORY_CARE_PROVIDER_SITE_OTHER): Payer: Self-pay

## 2017-08-11 ENCOUNTER — Encounter: Payer: Self-pay | Admitting: Family Medicine

## 2017-08-11 NOTE — Telephone Encounter (Signed)
Looks like pt has been seen by Dr. Reece AgarG one time, 04/2011.  [facility] who has been prescribing metformin.  Is this something we would refill?

## 2017-08-12 NOTE — Telephone Encounter (Signed)
Austin Hernandez has new patient appointment with Dr Jimmey RalphParker @ [facility] [place].  Do you want me to still schedule cpx with dr g?

## 2017-08-12 NOTE — Telephone Encounter (Signed)
At this point, I would say the next availability.

## 2017-08-12 NOTE — Telephone Encounter (Signed)
Let's schedule CPE and we can discuss at that time.  Thanks.

## 2017-08-12 NOTE — Telephone Encounter (Signed)
Yes, thank you.

## 2017-08-12 NOTE — Telephone Encounter (Signed)
Where can I work pt in?  His appointment with Dr Jimmey RalphParker is 08/19/17

## 2017-08-13 NOTE — Telephone Encounter (Signed)
Spoke with pt he has enough meds to last till he sees dr Jimmey Ralphparker on the 2/19

## 2017-08-13 NOTE — Telephone Encounter (Signed)
Noted  

## 2017-08-19 ENCOUNTER — Ambulatory Visit (INDEPENDENT_AMBULATORY_CARE_PROVIDER_SITE_OTHER): Payer: BLUE CROSS/BLUE SHIELD | Admitting: Family Medicine

## 2017-08-19 ENCOUNTER — Encounter: Payer: Self-pay | Admitting: Family Medicine

## 2017-08-19 VITALS — BP 124/78 | HR 86 | Temp 98.3°F | Ht 73.0 in | Wt 294.8 lb

## 2017-08-19 DIAGNOSIS — B009 Herpesviral infection, unspecified: Secondary | ICD-10-CM

## 2017-08-19 DIAGNOSIS — R7303 Prediabetes: Secondary | ICD-10-CM

## 2017-08-19 DIAGNOSIS — Z1159 Encounter for screening for other viral diseases: Secondary | ICD-10-CM

## 2017-08-19 DIAGNOSIS — E559 Vitamin D deficiency, unspecified: Secondary | ICD-10-CM

## 2017-08-19 DIAGNOSIS — Z114 Encounter for screening for human immunodeficiency virus [HIV]: Secondary | ICD-10-CM | POA: Diagnosis not present

## 2017-08-19 LAB — HEMOGLOBIN A1C: Hgb A1c MFr Bld: 6.1 % (ref 4.6–6.5)

## 2017-08-19 LAB — VITAMIN D 25 HYDROXY (VIT D DEFICIENCY, FRACTURES): VITD: 33.58 ng/mL (ref 30.00–100.00)

## 2017-08-19 MED ORDER — METFORMIN HCL 500 MG PO TABS: 500.0000 mg | ORAL_TABLET | Freq: Every day | ORAL | 3 refills | Status: DC

## 2017-08-19 MED ORDER — VITAMIN D (ERGOCALCIFEROL) 1.25 MG (50000 UNIT) PO CAPS: 50000.0000 [IU] | ORAL_CAPSULE | ORAL | 3 refills | Status: DC

## 2017-08-19 MED ORDER — VALACYCLOVIR HCL 500 MG PO TABS: 500.0000 mg | ORAL_TABLET | Freq: Two times a day (BID) | ORAL | 0 refills | Status: DC

## 2017-08-19 NOTE — Patient Instructions (Signed)
No med changes today.  We will check your A1c and vitamin D level.   Come back to see me in in September, or sooner as needed.  Take care, Dr Jimmey RalphParker

## 2017-08-19 NOTE — Assessment & Plan Note (Signed)
No current outbreak.  Sent in prescription for Valtrex for use as needed for outbreaks.

## 2017-08-19 NOTE — Progress Notes (Signed)
   Subjective:  Austin Hernandez is a 52 y.o. male who presents today with a chief complaint of prediabetes.   HPI:  Prediabetes, established problem, stable Several year history.  Patient currently on metformin 500 mg daily.  He is tolerating this well without any side effects.  No polyuria or polydipsia.  No diarrhea or abdominal pain.  Needs refill of metformin today.  Vitamin D deficiency, established problem, stable Currently on vitamin D 50,000 international units every 2 weeks.  Tolerating this well without side effects.    History of oral herpes, established problem, stable Patient takes Valtrex 500 mg twice daily as needed for outbreaks.  He has not had an outbreak for several months.  Needs a refill on Valtrex today.  ROS: Per HPI  PMH: He reports that he quit smoking about 7 years ago. His smoking use included cigarettes. He has a 25.00 pack-year smoking history. he has never used smokeless tobacco. He reports that he drinks about 3.0 - 3.6 oz of alcohol per week. He reports that he does not use drugs.  Objective:  Physical Exam: BP 124/78 (BP Location: Right Arm, Patient Position: Sitting, Cuff Size: Large)   Pulse 86   Temp 98.3 F (36.8 C) (Oral)   Ht 6\' 1"  (1.854 m)   Wt 294 lb 12.8 oz (133.7 kg)   SpO2 95%   BMI 38.89 kg/m   Gen: NAD, resting comfortably CV: RRR with no murmurs appreciated Pulm: NWOB, CTAB with no crackles, wheezes, or rhonchi GI: Normal bowel sounds present. Soft, Nontender, Nondistended. MSK: No edema, cyanosis, or clubbing noted Skin: Warm, dry Neuro: Grossly normal, moves all extremities Psych: Normal affect and thought content  Assessment/Plan:  Prediabetes Check A1c.  Continue metformin 500 mg daily.  Vitamin D deficiency Continue current dose of vitamin D supplementation.  Will check vitamin D level today.  Would be reasonable to decrease to over-the-counter strength if vitamin D level at goal.  Herpes simplex virus (HSV)  infection No current outbreak.  Sent in prescription for Valtrex for use as needed for outbreaks.  Katina Degreealeb M. Jimmey RalphParker, MD 08/19/2017 9:14 AM

## 2017-08-19 NOTE — Assessment & Plan Note (Signed)
Check A1c.  Continue metformin 500 mg daily. ?

## 2017-08-19 NOTE — Assessment & Plan Note (Signed)
Continue current dose of vitamin D supplementation.  Will check vitamin D level today.  Would be reasonable to decrease to over-the-counter strength if vitamin D level at goal.

## 2017-08-20 LAB — HIV ANTIBODY (ROUTINE TESTING W REFLEX): HIV 1&2 Ab, 4th Generation: NONREACTIVE

## 2017-08-20 LAB — HEPATITIS C ANTIBODY
Hepatitis C Ab: NONREACTIVE
SIGNAL TO CUT-OFF: 0.01 (ref <1.00)

## 2017-08-21 NOTE — Progress Notes (Signed)
Dr Lavone NeriParker's interpretation of your lab work:  Your HIV and hepatitis C tests are negative. Your vitamin D level is normal. Please continue your current dose of vitamin D - we do not need to make any changes.  Your blood sugar level is up a little since the last time we checked. I would like to keep your metformin at the same dose for now. We can recheck in 6-12 months.   If you have any additional questions, please give us a call or send us a message through [facility].  Take care, Dr Jimmey RalphParker

## 2017-11-11 ENCOUNTER — Other Ambulatory Visit: Payer: Self-pay | Admitting: Family Medicine

## 2017-12-18 DIAGNOSIS — J019 Acute sinusitis, unspecified: Secondary | ICD-10-CM | POA: Diagnosis not present

## 2017-12-18 DIAGNOSIS — H66003 Acute suppurative otitis media without spontaneous rupture of ear drum, bilateral: Secondary | ICD-10-CM | POA: Diagnosis not present

## 2018-02-23 ENCOUNTER — Ambulatory Visit (INDEPENDENT_AMBULATORY_CARE_PROVIDER_SITE_OTHER): Payer: BLUE CROSS/BLUE SHIELD | Admitting: Pulmonary Disease

## 2018-02-23 ENCOUNTER — Other Ambulatory Visit: Payer: Self-pay

## 2018-02-23 ENCOUNTER — Encounter: Payer: Self-pay | Admitting: Pulmonary Disease

## 2018-02-23 VITALS — BP 138/86 | HR 101 | Ht 72.75 in | Wt 315.2 lb

## 2018-02-23 DIAGNOSIS — G4733 Obstructive sleep apnea (adult) (pediatric): Secondary | ICD-10-CM | POA: Diagnosis not present

## 2018-02-23 DIAGNOSIS — Z789 Other specified health status: Secondary | ICD-10-CM | POA: Diagnosis not present

## 2018-02-23 NOTE — Patient Instructions (Signed)
Will arrange for referral to Dr. Mark Katz to assess for oral appliance to treat obstructive sleep apnea  Follow up in 6 months 

## 2018-02-23 NOTE — Progress Notes (Signed)
[facility]  Chief Complaint  Patient presents with  . Follow-up    wants referral to Dr. Myrtis Ser, wants new mask for travel    Constitutional: BP 138/86 (BP Location: Left Arm, Patient Position: Sitting, Cuff Size: Normal)   Pulse (!) 101   Ht 6' 0.75" (1.848 m)   Wt (!) 315 lb 3.2 oz (143 kg)   SpO2 96%   BMI 41.87 kg/m   History of Present Illness: Austin Hernandez is a 52 y.o. male with obstructive sleep apnea.  He had sleep study in June 2018.  Found to have severe sleep apnea.  Started on CPAP.  Couldn't tolerate wearing mask or pressure settings.  Tried several different masks.  Also had concern about how he would travel for work with CPAP.  No longer has CPAP.  He saw an advertisement for Dr. Myrtis Ser regarding an oral appliance.  Comprehensive Respiratory Exam:  Appearance - well kempt  ENMT - nasal mucosa moist, turbinates clear, midline nasal septum, no dental lesions, no gingival bleeding, no oral exudates, no tonsillar hypertrophy, MP 3, scalloped tongue, elongated uvula Neck - no masses, trachea midline, no thyromegaly, no elevation in JVP Respiratory - normal appearance of chest wall, normal respiratory effort w/o accessory muscle use, no dullness on percussion, no wheezing or rales CV - s1s2 regular rate and rhythm, no murmurs, no peripheral edema, radial pulses symmetric GI - soft, non tender, no masses Lymph - no adenopathy noted in neck and axillary areas MSK - normal muscle strength and tone, normal gait Ext - no cyanosis, clubbing, or joint inflammation noted Skin - no rashes, lesions, or ulcers Neuro - oriented to person, place, and time Psych - normal mood and affect   Assessment/Plan:  Obstructive sleep apnea. - he was not able to tolerate CPAP - reviewed alternative therapies for sleep apnea > weight loss, oral appliance, surgical interventions - will arrange for referral to Dr. Myrtis Ser to assess for oral appliance, and  he is going to work on diet modification and exercise   Patient Instructions  Will arrange for referral to Dr. Althea Grimmer to assess for oral appliance to treat obstructive sleep apnea  Follow up in 6 months    Coralyn Helling, MD [facility] 02/23/2018, 12:36 PM  Flow Sheet  Sleep tests: HST 12/10/16 >> AHI 43.4, SaO2 low 68%  Past Medical History: He  has a past medical history of Back pain, GERD (gastroesophageal reflux disease), History of chicken pox, HLD (hyperlipidemia), Joint pain, OSA (obstructive sleep apnea) (12/11/2016), Restless leg, Seasonal allergies, and Sleep apnea.  Past Surgical History: He  has a past surgical history that includes Adenoidectomy (1970); Ankle surgery (1980s); and Umbilical hernia repair.  Family History: His family history includes Arthritis in his mother; Cancer in his paternal grandfather; Cancer (age of onset: 13) in his father; Diabetes in his maternal grandmother; Hyperlipidemia in his mother; Hypertension in his mother; Squamous cell carcinoma in his father; Stroke (age of onset: 69) in his maternal grandmother.  Social History: He  reports that he quit smoking about 7 years ago. His smoking use included cigarettes. He has a 25.00 pack-year smoking history. He has never used smokeless tobacco. He reports that he drinks about 5.0 - 6.0 standard drinks of alcohol per week. He reports that he does not use drugs.  Medications: Allergies as of 02/23/2018   No Known Allergies     Medication List        Accurate  as of 02/23/18 12:36 PM. Always use your most recent med list.          metFORMIN 500 MG tablet Commonly known as:  GLUCOPHAGE Take 1 tablet (500 mg total) by mouth daily with breakfast.   valACYclovir 500 MG tablet Commonly known as:  VALTREX TAKE 1 TABLET (500 MG TOTAL) BY MOUTH 2 (TWO) TIMES DAILY FOR 3 DAYS.   Vitamin D (Ergocalciferol) 50000 units Caps capsule Commonly known as:  DRISDOL Take 1 capsule  (50,000 Units total) by mouth every 7 (seven) days.

## 2018-06-05 ENCOUNTER — Other Ambulatory Visit: Payer: Self-pay | Admitting: Family Medicine

## 2018-06-08 ENCOUNTER — Other Ambulatory Visit: Payer: Self-pay

## 2018-06-08 MED ORDER — VALACYCLOVIR HCL 500 MG PO TABS: ORAL_TABLET | ORAL | 0 refills | Status: DC

## 2018-07-16 ENCOUNTER — Ambulatory Visit (INDEPENDENT_AMBULATORY_CARE_PROVIDER_SITE_OTHER): Payer: BLUE CROSS/BLUE SHIELD | Admitting: Family Medicine

## 2018-07-16 ENCOUNTER — Encounter: Payer: Self-pay | Admitting: Family Medicine

## 2018-07-16 VITALS — BP 142/82 | HR 95 | Temp 99.3°F | Ht 73.0 in | Wt 312.4 lb

## 2018-07-16 DIAGNOSIS — J101 Influenza due to other identified influenza virus with other respiratory manifestations: Secondary | ICD-10-CM | POA: Diagnosis not present

## 2018-07-16 LAB — POCT INFLUENZA A/B
Influenza A, POC: POSITIVE — AB
Influenza B, POC: NEGATIVE

## 2018-07-16 MED ORDER — OSELTAMIVIR PHOSPHATE 75 MG PO CAPS: 75.0000 mg | ORAL_CAPSULE | Freq: Two times a day (BID) | ORAL | 0 refills | Status: DC

## 2018-07-16 MED ORDER — IPRATROPIUM BROMIDE 0.06 % NA SOLN: 2.0000 | Freq: Four times a day (QID) | NASAL | 0 refills | Status: DC

## 2018-07-16 NOTE — Patient Instructions (Addendum)
You have the flu.   Start the atrovent and tamiflu.  Please stay well hydrated.  You can take tylenol and/or motrin as needed for low grade fever and pain.  Please let me know if your symptoms worsen or fail to improve.  Take care, Dr [name]

## 2018-07-16 NOTE — Progress Notes (Signed)
   Subjective:  Austin Hernandez is a 53 y.o. male who presents today for same-day appointment with a chief complaint of cough.   HPI:  Cough, Acute problem Started 2 days ago. Associated with fever, wheeze, and sputum production.  He has had a few sick contacts at work.  No specific treatments tried.  Symptoms seem to be worsening.  No nausea.  No vomiting.  No rash.  No other obvious alleviating or aggravating factors.   ROS: Per HPI  PMH: He reports that he quit smoking about 8 years ago. His smoking use included cigarettes. He has a 25.00 pack-year smoking history. He has never used smokeless tobacco. He reports current alcohol use of about 5.0 - 6.0 standard drinks of alcohol per week. He reports that he does not use drugs.  Objective:  Physical Exam: BP (!) 142/82 (BP Location: Left Arm, Patient Position: Sitting, Cuff Size: Large)   Pulse 95   Temp 99.3 F (37.4 C) (Oral)   Ht 6\' 1"  (1.854 m)   Wt (!) 312 lb 6.1 oz (141.7 kg)   SpO2 97%   BMI 41.21 kg/m   Wt Readings from Last 3 Encounters:  07/16/18 (!) 312 lb 6.1 oz (141.7 kg)  02/23/18 (!) 315 lb 3.2 oz (143 kg)  08/19/17 294 lb 12.8 oz (133.7 kg)   Gen: NAD, resting comfortably HEENT: OP erythematous with no exudate.  Nasal mucosa erythematous and boggy with clear discharge. CV: RRR with no murmurs appreciated Pulm: NWOB, CTAB with no crackles, wheezes, or rhonchi  Results for orders placed or performed in visit on 07/16/18 (from the past 24 hour(s))  POCT Influenza A/B     Status: Abnormal   Collection Time: 07/16/18  4:33 PM  Result Value Ref Range   Influenza A, POC Positive (A) Negative   Influenza B, POC Negative Negative    Assessment/Plan:  Influenza A Rapid flu positive.  Start Tamiflu.  Start Atrovent for his rhinorrhea and sinus congestion.  Recommended over-the-counter analgesics as needed.  Encouraged good oral hydration.  Discussed reasons to return to care and seek emergent care.  Follow-up as  needed.  Preventative Healthcare Patient was instructed to return soon for CPE. Health Maintenance Due  Topic Date Due  . TETANUS/TDAP  01/31/1983  . COLONOSCOPY  01/30/2014  . INFLUENZA VACCINE  01/29/2018   Katina Degree. Austin Ralph, MD 07/16/2018 5:26 PM

## 2018-07-21 ENCOUNTER — Other Ambulatory Visit: Payer: Self-pay | Admitting: Family Medicine

## 2018-07-21 DIAGNOSIS — E559 Vitamin D deficiency, unspecified: Secondary | ICD-10-CM

## 2018-08-05 ENCOUNTER — Telehealth: Payer: Self-pay | Admitting: Family Medicine

## 2018-08-05 NOTE — Telephone Encounter (Signed)
Copied from CRM [id number]. Topic: Quick Communication - See Telephone Encounter >> Aug 05, 2018  5:39 PM Lorrine Kin, NT wrote: CRM for notification. See Telephone encounter for: 08/05/18. Patient calling and states that he was diagnosed with Flu A on 07/16/2018. States that he is feeling a lot better, but is having post flu cough, nasal congestion and pressure, and green/yellow phlegm. Would like to know if Dr [name] could send a medication to help kick this out? [id number] - [place],  - [address]

## 2018-08-06 ENCOUNTER — Other Ambulatory Visit: Payer: Self-pay

## 2018-08-06 MED ORDER — DOXYCYCLINE HYCLATE 100 MG PO TABS: 100.0000 mg | ORAL_TABLET | Freq: Two times a day (BID) | ORAL | 0 refills | Status: AC

## 2018-08-06 NOTE — Telephone Encounter (Signed)
See note

## 2018-08-06 NOTE — Telephone Encounter (Signed)
Rx sent to pharmacy   

## 2018-08-06 NOTE — Telephone Encounter (Signed)
Please send in doxycyline 100mg  bid x7 days. Needs follow up appointment if not improving.  Austin Degreealeb M. Jimmey RalphParker, MD 08/06/2018 1:05 PM

## 2018-08-06 NOTE — Telephone Encounter (Signed)
Please advise 

## 2018-08-28 ENCOUNTER — Other Ambulatory Visit: Payer: Self-pay | Admitting: Family Medicine

## 2018-08-28 DIAGNOSIS — R7303 Prediabetes: Secondary | ICD-10-CM

## 2018-09-15 ENCOUNTER — Ambulatory Visit (INDEPENDENT_AMBULATORY_CARE_PROVIDER_SITE_OTHER): Payer: BLUE CROSS/BLUE SHIELD | Admitting: Family Medicine

## 2018-09-15 ENCOUNTER — Encounter: Payer: Self-pay | Admitting: Family Medicine

## 2018-09-15 ENCOUNTER — Other Ambulatory Visit: Payer: Self-pay

## 2018-09-15 VITALS — BP 112/84 | HR 78 | Temp 98.5°F | Ht 72.25 in | Wt 311.2 lb

## 2018-09-15 DIAGNOSIS — R7303 Prediabetes: Secondary | ICD-10-CM

## 2018-09-15 DIAGNOSIS — Z1211 Encounter for screening for malignant neoplasm of colon: Secondary | ICD-10-CM | POA: Diagnosis not present

## 2018-09-15 DIAGNOSIS — Z23 Encounter for immunization: Secondary | ICD-10-CM | POA: Diagnosis not present

## 2018-09-15 DIAGNOSIS — Z0001 Encounter for general adult medical examination with abnormal findings: Secondary | ICD-10-CM

## 2018-09-15 DIAGNOSIS — Z6841 Body Mass Index (BMI) 40.0 and over, adult: Secondary | ICD-10-CM

## 2018-09-15 DIAGNOSIS — Z8619 Personal history of other infectious and parasitic diseases: Secondary | ICD-10-CM

## 2018-09-15 DIAGNOSIS — G4733 Obstructive sleep apnea (adult) (pediatric): Secondary | ICD-10-CM

## 2018-09-15 DIAGNOSIS — Z13220 Encounter for screening for lipoid disorders: Secondary | ICD-10-CM

## 2018-09-15 LAB — COMPREHENSIVE METABOLIC PANEL
ALT: 197 U/L — ABNORMAL HIGH (ref 0–53)
AST: 82 U/L — ABNORMAL HIGH (ref 0–37)
Albumin: 4.3 g/dL (ref 3.5–5.2)
Alkaline Phosphatase: 67 U/L (ref 39–117)
BUN: 17 mg/dL (ref 6–23)
CO2: 27 mEq/L (ref 19–32)
Calcium: 9.4 mg/dL (ref 8.4–10.5)
Chloride: 104 mEq/L (ref 96–112)
Creatinine, Ser: 0.88 mg/dL (ref 0.40–1.50)
GFR: 90.04 mL/min (ref >60.00)
GLUCOSE: 119 mg/dL — AB (ref 70–99)
Potassium: 4.2 mEq/L (ref 3.5–5.1)
Sodium: 139 mEq/L (ref 135–145)
Total Bilirubin: 0.6 mg/dL (ref 0.2–1.2)
Total Protein: 6.8 g/dL (ref 6.0–8.3)

## 2018-09-15 LAB — CBC
HCT: 46.4 % (ref 39.0–52.0)
Hemoglobin: 16.2 g/dL (ref 13.0–17.0)
MCHC: 34.9 g/dL (ref 30.0–36.0)
MCV: 93.6 fl (ref 78.0–100.0)
Platelets: 170 10*3/uL (ref 150.0–400.0)
RBC: 4.96 Mil/uL (ref 4.22–5.81)
RDW: 13.7 % (ref 11.5–15.5)
WBC: 5.4 10*3/uL (ref 4.0–10.5)

## 2018-09-15 LAB — LIPID PANEL
Cholesterol: 291 mg/dL — ABNORMAL HIGH (ref 0–200)
HDL: 48.7 mg/dL (ref >39.00)
LDL Cholesterol: 213 mg/dL — ABNORMAL HIGH (ref 0–99)
NonHDL: 242.41
Total CHOL/HDL Ratio: 6
Triglycerides: 149 mg/dL (ref 0.0–149.0)
VLDL: 29.8 mg/dL (ref 0.0–40.0)

## 2018-09-15 LAB — HEMOGLOBIN A1C: Hgb A1c MFr Bld: 6.2 % (ref 4.6–6.5)

## 2018-09-15 NOTE — Assessment & Plan Note (Signed)
Stable.  Continue Valtrex as needed. 

## 2018-09-15 NOTE — Assessment & Plan Note (Signed)
Continue metformin 500 mg daily.  Check A1c.

## 2018-09-15 NOTE — Patient Instructions (Addendum)
It was very nice to see you today!  No medication changes today.  We will check blood work.  Come back in 1 year for your next physical, or sooner as needed.   Take care, Dr Jerline Pain   Preventive Care 40-64 Years, Male Preventive care refers to lifestyle choices and visits with your health care provider that can promote health and wellness. What does preventive care include?   A yearly physical exam. This is also called an annual well check.  Dental exams once or twice a year.  Routine eye exams. Ask your health care provider how often you should have your eyes checked.  Personal lifestyle choices, including: ? Daily care of your teeth and gums. ? Regular physical activity. ? Eating a healthy diet. ? Avoiding tobacco and drug use. ? Limiting alcohol use. ? Practicing safe sex. ? Taking low-dose aspirin every day starting at age 33. What happens during an annual well check? The services and screenings done by your health care provider during your annual well check will depend on your age, overall health, lifestyle risk factors, and family history of disease. Counseling Your health care provider may ask you questions about your:  Alcohol use.  Tobacco use.  Drug use.  Emotional well-being.  Home and relationship well-being.  Sexual activity.  Eating habits.  Work and work Statistician. Screening You may have the following tests or measurements:  Height, weight, and BMI.  Blood pressure.  Lipid and cholesterol levels. These may be checked every 5 years, or more frequently if you are over 50 years old.  Skin check.  Lung cancer screening. You may have this screening every year starting at age 81 if you have a 30-pack-year history of smoking and currently smoke or have quit within the past 15 years.  Colorectal cancer screening. All adults should have this screening starting at age 58 and continuing until age 35. Your health care provider may recommend  screening at age 43. You will have tests every 1-10 years, depending on your results and the type of screening test. People at increased risk should start screening at an earlier age. Screening tests may include: ? Guaiac-based fecal occult blood testing. ? Fecal immunochemical test (FIT). ? Stool DNA test. ? Virtual colonoscopy. ? Sigmoidoscopy. During this test, a flexible tube with a tiny camera (sigmoidoscope) is used to examine your rectum and lower colon. The sigmoidoscope is inserted through your anus into your rectum and lower colon. ? Colonoscopy. During this test, a long, thin, flexible tube with a tiny camera (colonoscope) is used to examine your entire colon and rectum.  Prostate cancer screening. Recommendations will vary depending on your family history and other risks.  Hepatitis C blood test.  Hepatitis B blood test.  Sexually transmitted disease (STD) testing.  Diabetes screening. This is done by checking your blood sugar (glucose) after you have not eaten for a while (fasting). You may have this done every 1-3 years. Discuss your test results, treatment options, and if necessary, the need for more tests with your health care provider. Vaccines Your health care provider may recommend certain vaccines, such as:  Influenza vaccine. This is recommended every year.  Tetanus, diphtheria, and acellular pertussis (Tdap, Td) vaccine. You may need a Td booster every 10 years.  Varicella vaccine. You may need this if you have not been vaccinated.  Zoster vaccine. You may need this after age 54.  Measles, mumps, and rubella (MMR) vaccine. You may need at least one dose of  MMR if you were born in 1957 or later. You may also need a second dose.  Pneumococcal 13-valent conjugate (PCV13) vaccine. You may need this if you have certain conditions and have not been vaccinated.  Pneumococcal polysaccharide (PPSV23) vaccine. You may need one or two doses if you smoke cigarettes or if  you have certain conditions.  Meningococcal vaccine. You may need this if you have certain conditions.  Hepatitis A vaccine. You may need this if you have certain conditions or if you travel or work in places where you may be exposed to hepatitis A.  Hepatitis B vaccine. You may need this if you have certain conditions or if you travel or work in places where you may be exposed to hepatitis B.  Haemophilus influenzae type b (Hib) vaccine. You may need this if you have certain risk factors. Talk to your health care provider about which screenings and vaccines you need and how often you need them. This information is not intended to replace advice given to you by your health care provider. Make sure you discuss any questions you have with your health care provider. Document Released: 07/14/2015 Document Revised: 08/07/2017 Document Reviewed: 04/18/2015 Elsevier Interactive Patient Education  2019 [employer].

## 2018-09-15 NOTE — Progress Notes (Signed)
Chief Complaint:  Austin Hernandez is a 53 y.o. male who presents today for his annual comprehensive physical exam.    Assessment/Plan:  Prediabetes Continue metformin 500 mg daily.  Check A1c.  OSA (obstructive sleep apnea) Stable.  Continue mouth appliance.  Letter was given to patient stating that this device helps with the symptoms.  History of cold sores Stable.  Continue Valtrex as needed.  BMI 41 Discussed lifestyle modifications.  Preventative Healthcare: Check lipid panel.  Order placed for Cologuard. Tdap and flu vaccine given today.   Patient Counseling(The following topics were reviewed and/or handout was given):  -Nutrition: Stressed importance of moderation in sodium/caffeine intake, saturated fat and cholesterol, caloric balance, sufficient intake of fresh fruits, vegetables, and fiber.  -Stressed the importance of regular exercise.   -Substance Abuse: Discussed cessation/primary prevention of tobacco, alcohol, or other drug use; driving or other dangerous activities under the influence; availability of treatment for abuse.   -Injury prevention: Discussed safety belts, safety helmets, smoke detector, smoking near bedding or upholstery.   -Sexuality: Discussed sexually transmitted diseases, partner selection, use of condoms, avoidance of unintended pregnancy and contraceptive alternatives.   -Dental health: Discussed importance of regular tooth brushing, flossing, and dental visits.  -Health maintenance and immunizations reviewed. Please refer to Health maintenance section.  Return to care in 1 year for next preventative visit.     Subjective:  HPI:  He has no acute complaints today.   His stable, chronic medical conditions are outlined below:  # Prediabetes - On metformin 500mg  daily and tolerating well. - ROS: No polyuria or polydipsia  # OSA not on CPAP - Uses mouth guard at night which seems to help with his symptoms.   # Vitamin D Deficiency - No  longer taking vitamin D orally  # History of Cold Sores - Takes valtrex as needed - No recent outbreaks  Lifestyle Diet: No specific diets or eating plans.  Exercise: No specific exercises.   Depression screen PHQ 2/9 09/15/2018  Decreased Interest 0  Down, Depressed, Hopeless 0  PHQ - 2 Score 0  Altered sleeping -  Tired, decreased energy -  Change in appetite -  Feeling bad or failure about yourself  -  Trouble concentrating -  Moving slowly or fidgety/restless -  Suicidal thoughts -  PHQ-9 Score -   Health Maintenance Due  Topic Date Due  . TETANUS/TDAP  01/31/1983  . COLONOSCOPY  01/30/2014  . INFLUENZA VACCINE  01/29/2018    ROS: Per HPI, otherwise a complete review of systems was negative.   PMH:  The following were reviewed and entered/updated in epic: Past Medical History:  Diagnosis Date  . Back pain   . GERD (gastroesophageal reflux disease)   . History of chicken pox   . HLD (hyperlipidemia)   . Joint pain   . OSA (obstructive sleep apnea) 12/11/2016  . Restless leg   . Seasonal allergies   . Sleep apnea    Patient Active Problem List   Diagnosis Date Noted  . OSA (obstructive sleep apnea) 12/11/2016  . Prediabetes 12/09/2016  . Vitamin D deficiency 12/09/2016  . History of cold sores 03/28/2008  . HYPERLIPIDEMIA 03/28/2008  . TOBACCO USE 03/28/2008  . Allergic rhinitis 03/28/2008   Past Surgical History:  Procedure Laterality Date  . ADENOIDECTOMY  1970  . ANKLE SURGERY  1980s   torn ligaments; bilateral; separate surgeries (4 total), sports  . UMBILICAL HERNIA REPAIR      Family History  Problem  Relation Age of Onset  . Arthritis Mother   . Hypertension Mother   . Hyperlipidemia Mother   . Squamous cell carcinoma Father   . Cancer Father 25       throat cancer (squamous), smoker  . Stroke Maternal Grandmother 50  . Diabetes Maternal Grandmother   . Cancer Paternal Grandfather        lung?  . Coronary artery disease Neg Hx      Medications- reviewed and updated Current Outpatient Medications  Medication Sig Dispense Refill  . metFORMIN (GLUCOPHAGE) 500 MG tablet TAKE 1 TABLET BY MOUTH EVERY DAY WITH BREAKFAST 90 tablet 3  . valACYclovir (VALTREX) 500 MG tablet TAKE 1 TABLET (500 MG TOTAL) BY MOUTH 2 (TWO) TIMES DAILY FOR 3 DAYS. 30 tablet 0   No current facility-administered medications for this visit.     Allergies-reviewed and updated No Known Allergies  Social History   Socioeconomic History  . Marital status: Single    Spouse name: Not on file  . Number of children: Not on file  . Years of education: Not on file  . Highest education level: Not on file  Occupational History  . Occupation: GENERAL MANAGER [employer]  Social Needs  . Financial resource strain: Not on file  . Food insecurity:    Worry: Not on file    Inability: Not on file  . Transportation needs:    Medical: Not on file    Non-medical: Not on file  Tobacco Use  . Smoking status: Former Smoker    Packs/day: 1.00    Years: 25.00    Pack years: 25.00    Types: Cigarettes    Last attempt to quit: 07/01/2010    Years since quitting: 8.2  . Smokeless tobacco: Never Used  Substance and Sexual Activity  . Alcohol use: Yes    Alcohol/week: 5.0 - 6.0 standard drinks    Types: 5 - 6 Glasses of wine per week    Comment: Occasional (weekends)  . Drug use: No  . Sexual activity: Not on file  Lifestyle  . Physical activity:    Days per week: Not on file    Minutes per session: Not on file  . Stress: Not on file  Relationships  . Social connections:    Talks on phone: Not on file    Gets together: Not on file    Attends religious service: Not on file    Active member of club or organization: Not on file    Attends meetings of clubs or organizations: Not on file    Relationship status: Not on file  Other Topics Concern  . 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  Some fruit/vegetables.        Objective:  Physical Exam: BP 112/84 (BP Location: Left Arm, Patient Position: Sitting, Cuff Size: Large)   Pulse 78   Temp 98.5 F (36.9 C) (Oral)   Ht 6' 0.25" (1.835 m)   Wt (!) 311 lb 3.2 oz (141.2 kg)   SpO2 95%   BMI 41.91 kg/m   Body mass index is 41.91 kg/m. Wt Readings from Last 3 Encounters:  09/15/18 (!) 311 lb 3.2 oz (141.2 kg)  07/16/18 (!) 312 lb 6.1 oz (141.7 kg)  02/23/18 (!) 315 lb 3.2  oz (143 kg)   Gen: NAD, resting comfortably HEENT: TMs normal bilaterally. OP clear. No thyromegaly noted.  CV: RRR with no murmurs appreciated Pulm: NWOB, CTAB with no crackles, wheezes, or rhonchi GI: Normal bowel sounds present. Soft, Nontender, Nondistended. MSK: no edema, cyanosis, or clubbing noted Skin: warm, dry Neuro: CN2-12 grossly intact. Strength 5/5 in upper and lower extremities. Reflexes symmetric and intact bilaterally.  Psych: Normal affect and thought content     Mechell Girgis M. Jimmey RalphParker, MD 09/15/2018 10:48 AM

## 2018-09-15 NOTE — Assessment & Plan Note (Signed)
Stable.  Continue mouth appliance.  Letter was given to patient stating that this device helps with the symptoms.

## 2018-09-16 NOTE — Progress Notes (Signed)
Please inform patient of the following:  His blood sugar levels are stable, but his liver tests and cholesterol levels are significantly elevated. Would like for him to come back in 1-2 weeks to recheck liver tests. He will probably need a cholesterol medication, but we cannot start until his liver numbers are better. Please place future order for CMET.  Katina Degree. [name], MD 09/16/2018 7:51 AM

## 2018-09-17 ENCOUNTER — Other Ambulatory Visit: Payer: Self-pay | Admitting: Family Medicine

## 2018-09-17 DIAGNOSIS — R7989 Other specified abnormal findings of blood chemistry: Secondary | ICD-10-CM

## 2018-09-17 DIAGNOSIS — R945 Abnormal results of liver function studies: Principal | ICD-10-CM

## 2018-09-22 DIAGNOSIS — Z1211 Encounter for screening for malignant neoplasm of colon: Secondary | ICD-10-CM | POA: Diagnosis not present

## 2018-09-22 LAB — COLOGUARD: Cologuard: NEGATIVE

## 2018-11-18 ENCOUNTER — Other Ambulatory Visit: Payer: Self-pay | Admitting: Family Medicine

## 2018-11-19 MED ORDER — VALACYCLOVIR HCL 500 MG PO TABS: ORAL_TABLET | ORAL | 0 refills | Status: DC

## 2018-11-20 DIAGNOSIS — M25551 Pain in right hip: Secondary | ICD-10-CM | POA: Diagnosis not present

## 2018-11-20 DIAGNOSIS — M47816 Spondylosis without myelopathy or radiculopathy, lumbar region: Secondary | ICD-10-CM | POA: Diagnosis not present

## 2018-11-20 DIAGNOSIS — M9903 Segmental and somatic dysfunction of lumbar region: Secondary | ICD-10-CM | POA: Diagnosis not present

## 2018-11-20 DIAGNOSIS — M9905 Segmental and somatic dysfunction of pelvic region: Secondary | ICD-10-CM | POA: Diagnosis not present

## 2018-11-20 DIAGNOSIS — M545 Low back pain: Secondary | ICD-10-CM | POA: Diagnosis not present

## 2018-11-20 DIAGNOSIS — M5417 Radiculopathy, lumbosacral region: Secondary | ICD-10-CM | POA: Diagnosis not present

## 2018-11-24 DIAGNOSIS — M9903 Segmental and somatic dysfunction of lumbar region: Secondary | ICD-10-CM | POA: Diagnosis not present

## 2018-11-24 DIAGNOSIS — M25551 Pain in right hip: Secondary | ICD-10-CM | POA: Diagnosis not present

## 2018-11-24 DIAGNOSIS — M9905 Segmental and somatic dysfunction of pelvic region: Secondary | ICD-10-CM | POA: Diagnosis not present

## 2018-11-24 DIAGNOSIS — M5417 Radiculopathy, lumbosacral region: Secondary | ICD-10-CM | POA: Diagnosis not present

## 2018-11-24 DIAGNOSIS — M9904 Segmental and somatic dysfunction of sacral region: Secondary | ICD-10-CM | POA: Diagnosis not present

## 2018-11-24 DIAGNOSIS — M9902 Segmental and somatic dysfunction of thoracic region: Secondary | ICD-10-CM | POA: Diagnosis not present

## 2018-11-27 ENCOUNTER — Other Ambulatory Visit: Payer: Self-pay | Admitting: Family Medicine

## 2018-12-01 DIAGNOSIS — M9905 Segmental and somatic dysfunction of pelvic region: Secondary | ICD-10-CM | POA: Diagnosis not present

## 2018-12-01 DIAGNOSIS — M545 Low back pain: Secondary | ICD-10-CM | POA: Diagnosis not present

## 2018-12-01 DIAGNOSIS — M9904 Segmental and somatic dysfunction of sacral region: Secondary | ICD-10-CM | POA: Diagnosis not present

## 2018-12-01 DIAGNOSIS — M9903 Segmental and somatic dysfunction of lumbar region: Secondary | ICD-10-CM | POA: Diagnosis not present

## 2018-12-01 DIAGNOSIS — M25551 Pain in right hip: Secondary | ICD-10-CM | POA: Diagnosis not present

## 2018-12-01 DIAGNOSIS — M9902 Segmental and somatic dysfunction of thoracic region: Secondary | ICD-10-CM | POA: Diagnosis not present

## 2018-12-04 DIAGNOSIS — M9902 Segmental and somatic dysfunction of thoracic region: Secondary | ICD-10-CM | POA: Diagnosis not present

## 2018-12-04 DIAGNOSIS — M545 Low back pain: Secondary | ICD-10-CM | POA: Diagnosis not present

## 2018-12-04 DIAGNOSIS — M25551 Pain in right hip: Secondary | ICD-10-CM | POA: Diagnosis not present

## 2018-12-04 DIAGNOSIS — M9904 Segmental and somatic dysfunction of sacral region: Secondary | ICD-10-CM | POA: Diagnosis not present

## 2018-12-04 DIAGNOSIS — M9905 Segmental and somatic dysfunction of pelvic region: Secondary | ICD-10-CM | POA: Diagnosis not present

## 2018-12-04 DIAGNOSIS — M9903 Segmental and somatic dysfunction of lumbar region: Secondary | ICD-10-CM | POA: Diagnosis not present

## 2018-12-14 DIAGNOSIS — M545 Low back pain: Secondary | ICD-10-CM | POA: Diagnosis not present

## 2018-12-14 DIAGNOSIS — M461 Sacroiliitis, not elsewhere classified: Secondary | ICD-10-CM | POA: Diagnosis not present

## 2018-12-14 DIAGNOSIS — M9905 Segmental and somatic dysfunction of pelvic region: Secondary | ICD-10-CM | POA: Diagnosis not present

## 2018-12-14 DIAGNOSIS — M25551 Pain in right hip: Secondary | ICD-10-CM | POA: Diagnosis not present

## 2018-12-14 DIAGNOSIS — M546 Pain in thoracic spine: Secondary | ICD-10-CM | POA: Diagnosis not present

## 2018-12-14 DIAGNOSIS — M9903 Segmental and somatic dysfunction of lumbar region: Secondary | ICD-10-CM | POA: Diagnosis not present

## 2018-12-14 DIAGNOSIS — M9902 Segmental and somatic dysfunction of thoracic region: Secondary | ICD-10-CM | POA: Diagnosis not present

## 2018-12-14 DIAGNOSIS — M9904 Segmental and somatic dysfunction of sacral region: Secondary | ICD-10-CM | POA: Diagnosis not present

## 2018-12-25 DIAGNOSIS — R739 Hyperglycemia, unspecified: Secondary | ICD-10-CM | POA: Diagnosis not present

## 2018-12-25 DIAGNOSIS — L659 Nonscarring hair loss, unspecified: Secondary | ICD-10-CM | POA: Diagnosis not present

## 2018-12-25 DIAGNOSIS — Z0001 Encounter for general adult medical examination with abnormal findings: Secondary | ICD-10-CM | POA: Diagnosis not present

## 2018-12-25 DIAGNOSIS — E785 Hyperlipidemia, unspecified: Secondary | ICD-10-CM | POA: Diagnosis not present

## 2018-12-28 DIAGNOSIS — M545 Low back pain: Secondary | ICD-10-CM | POA: Diagnosis not present

## 2018-12-28 DIAGNOSIS — M9903 Segmental and somatic dysfunction of lumbar region: Secondary | ICD-10-CM | POA: Diagnosis not present

## 2018-12-28 DIAGNOSIS — M9905 Segmental and somatic dysfunction of pelvic region: Secondary | ICD-10-CM | POA: Diagnosis not present

## 2018-12-28 DIAGNOSIS — M25551 Pain in right hip: Secondary | ICD-10-CM | POA: Diagnosis not present

## 2019-01-18 DIAGNOSIS — M546 Pain in thoracic spine: Secondary | ICD-10-CM | POA: Diagnosis not present

## 2019-01-18 DIAGNOSIS — M9904 Segmental and somatic dysfunction of sacral region: Secondary | ICD-10-CM | POA: Diagnosis not present

## 2019-01-18 DIAGNOSIS — M9905 Segmental and somatic dysfunction of pelvic region: Secondary | ICD-10-CM | POA: Diagnosis not present

## 2019-01-18 DIAGNOSIS — M25551 Pain in right hip: Secondary | ICD-10-CM | POA: Diagnosis not present

## 2019-01-18 DIAGNOSIS — M545 Low back pain: Secondary | ICD-10-CM | POA: Diagnosis not present

## 2019-01-18 DIAGNOSIS — M461 Sacroiliitis, not elsewhere classified: Secondary | ICD-10-CM | POA: Diagnosis not present

## 2019-01-18 DIAGNOSIS — M9903 Segmental and somatic dysfunction of lumbar region: Secondary | ICD-10-CM | POA: Diagnosis not present

## 2019-01-18 DIAGNOSIS — M9902 Segmental and somatic dysfunction of thoracic region: Secondary | ICD-10-CM | POA: Diagnosis not present

## 2019-02-23 DIAGNOSIS — M9902 Segmental and somatic dysfunction of thoracic region: Secondary | ICD-10-CM | POA: Diagnosis not present

## 2019-02-23 DIAGNOSIS — M25551 Pain in right hip: Secondary | ICD-10-CM | POA: Diagnosis not present

## 2019-02-23 DIAGNOSIS — M461 Sacroiliitis, not elsewhere classified: Secondary | ICD-10-CM | POA: Diagnosis not present

## 2019-02-23 DIAGNOSIS — M9904 Segmental and somatic dysfunction of sacral region: Secondary | ICD-10-CM | POA: Diagnosis not present

## 2019-02-23 DIAGNOSIS — M546 Pain in thoracic spine: Secondary | ICD-10-CM | POA: Diagnosis not present

## 2019-02-23 DIAGNOSIS — M9903 Segmental and somatic dysfunction of lumbar region: Secondary | ICD-10-CM | POA: Diagnosis not present

## 2019-02-23 DIAGNOSIS — M545 Low back pain: Secondary | ICD-10-CM | POA: Diagnosis not present

## 2019-02-23 DIAGNOSIS — M9905 Segmental and somatic dysfunction of pelvic region: Secondary | ICD-10-CM | POA: Diagnosis not present

## 2019-03-09 DIAGNOSIS — M9903 Segmental and somatic dysfunction of lumbar region: Secondary | ICD-10-CM | POA: Diagnosis not present

## 2019-03-09 DIAGNOSIS — M9905 Segmental and somatic dysfunction of pelvic region: Secondary | ICD-10-CM | POA: Diagnosis not present

## 2019-03-09 DIAGNOSIS — M461 Sacroiliitis, not elsewhere classified: Secondary | ICD-10-CM | POA: Diagnosis not present

## 2019-03-09 DIAGNOSIS — M25551 Pain in right hip: Secondary | ICD-10-CM | POA: Diagnosis not present

## 2019-03-09 DIAGNOSIS — M546 Pain in thoracic spine: Secondary | ICD-10-CM | POA: Diagnosis not present

## 2019-03-09 DIAGNOSIS — M545 Low back pain: Secondary | ICD-10-CM | POA: Diagnosis not present

## 2019-03-09 DIAGNOSIS — M9902 Segmental and somatic dysfunction of thoracic region: Secondary | ICD-10-CM | POA: Diagnosis not present

## 2019-03-09 DIAGNOSIS — M9904 Segmental and somatic dysfunction of sacral region: Secondary | ICD-10-CM | POA: Diagnosis not present

## 2019-03-10 DIAGNOSIS — L570 Actinic keratosis: Secondary | ICD-10-CM | POA: Diagnosis not present

## 2019-03-10 DIAGNOSIS — D229 Melanocytic nevi, unspecified: Secondary | ICD-10-CM | POA: Diagnosis not present

## 2019-03-10 DIAGNOSIS — L821 Other seborrheic keratosis: Secondary | ICD-10-CM | POA: Diagnosis not present

## 2019-03-17 ENCOUNTER — Other Ambulatory Visit: Payer: Self-pay | Admitting: Family Medicine

## 2019-03-18 MED ORDER — VALACYCLOVIR HCL 500 MG PO TABS: 500.0000 mg | ORAL_TABLET | Freq: Two times a day (BID) | ORAL | 0 refills | Status: DC

## 2019-03-25 ENCOUNTER — Ambulatory Visit: Payer: BLUE CROSS/BLUE SHIELD

## 2019-03-29 ENCOUNTER — Other Ambulatory Visit: Payer: Self-pay

## 2019-03-29 ENCOUNTER — Ambulatory Visit (INDEPENDENT_AMBULATORY_CARE_PROVIDER_SITE_OTHER): Payer: BC Managed Care – PPO

## 2019-03-29 DIAGNOSIS — Z23 Encounter for immunization: Secondary | ICD-10-CM | POA: Diagnosis not present

## 2019-03-30 DIAGNOSIS — M545 Low back pain: Secondary | ICD-10-CM | POA: Diagnosis not present

## 2019-03-30 DIAGNOSIS — M9905 Segmental and somatic dysfunction of pelvic region: Secondary | ICD-10-CM | POA: Diagnosis not present

## 2019-03-30 DIAGNOSIS — M25551 Pain in right hip: Secondary | ICD-10-CM | POA: Diagnosis not present

## 2019-03-30 DIAGNOSIS — M9903 Segmental and somatic dysfunction of lumbar region: Secondary | ICD-10-CM | POA: Diagnosis not present

## 2019-04-06 DIAGNOSIS — Z1159 Encounter for screening for other viral diseases: Secondary | ICD-10-CM | POA: Diagnosis not present

## 2019-04-06 DIAGNOSIS — Z20828 Contact with and (suspected) exposure to other viral communicable diseases: Secondary | ICD-10-CM | POA: Diagnosis not present

## 2019-06-16 ENCOUNTER — Other Ambulatory Visit: Payer: Self-pay | Admitting: Family Medicine

## 2019-06-16 ENCOUNTER — Other Ambulatory Visit: Payer: Self-pay

## 2019-06-16 MED ORDER — VALACYCLOVIR HCL 500 MG PO TABS: 500.0000 mg | ORAL_TABLET | Freq: Two times a day (BID) | ORAL | 0 refills | Status: DC

## 2019-06-23 ENCOUNTER — Other Ambulatory Visit: Payer: Self-pay | Admitting: Family Medicine

## 2019-07-08 ENCOUNTER — Other Ambulatory Visit: Payer: Self-pay | Admitting: Family Medicine

## 2019-07-26 ENCOUNTER — Other Ambulatory Visit: Payer: Self-pay

## 2019-07-26 MED ORDER — VALACYCLOVIR HCL 500 MG PO TABS: 500.0000 mg | ORAL_TABLET | Freq: Two times a day (BID) | ORAL | 0 refills | Status: DC

## 2019-08-25 DIAGNOSIS — Z20828 Contact with and (suspected) exposure to other viral communicable diseases: Secondary | ICD-10-CM | POA: Diagnosis not present

## 2019-08-25 DIAGNOSIS — J101 Influenza due to other identified influenza virus with other respiratory manifestations: Secondary | ICD-10-CM | POA: Diagnosis not present

## 2019-08-25 DIAGNOSIS — B974 Respiratory syncytial virus as the cause of diseases classified elsewhere: Secondary | ICD-10-CM | POA: Diagnosis not present

## 2019-08-25 DIAGNOSIS — U071 COVID-19: Secondary | ICD-10-CM | POA: Diagnosis not present

## 2019-09-10 ENCOUNTER — Other Ambulatory Visit: Payer: Self-pay | Admitting: Family Medicine

## 2019-09-10 DIAGNOSIS — R7303 Prediabetes: Secondary | ICD-10-CM

## 2019-09-20 DIAGNOSIS — J101 Influenza due to other identified influenza virus with other respiratory manifestations: Secondary | ICD-10-CM | POA: Diagnosis not present

## 2019-09-20 DIAGNOSIS — Z20828 Contact with and (suspected) exposure to other viral communicable diseases: Secondary | ICD-10-CM | POA: Diagnosis not present

## 2019-09-20 DIAGNOSIS — B974 Respiratory syncytial virus as the cause of diseases classified elsewhere: Secondary | ICD-10-CM | POA: Diagnosis not present

## 2019-09-20 DIAGNOSIS — U071 COVID-19: Secondary | ICD-10-CM | POA: Diagnosis not present

## 2020-04-25 ENCOUNTER — Encounter: Payer: Self-pay | Admitting: Family Medicine

## 2020-04-26 NOTE — Telephone Encounter (Signed)
Austin Hernandez, Mr. Butch is traveling to [place].  He is in search of a QR code that he can link his vaccinations to for easier travel.  Do you know of any information in regard to this?  Thanks TP

## 2020-05-02 DIAGNOSIS — Z20822 Contact with and (suspected) exposure to covid-19: Secondary | ICD-10-CM | POA: Diagnosis not present

## 2020-05-02 DIAGNOSIS — Z03818 Encounter for observation for suspected exposure to other biological agents ruled out: Secondary | ICD-10-CM | POA: Diagnosis not present

## 2020-05-18 ENCOUNTER — Encounter: Payer: Self-pay | Admitting: Family Medicine

## 2020-05-18 ENCOUNTER — Ambulatory Visit (INDEPENDENT_AMBULATORY_CARE_PROVIDER_SITE_OTHER): Payer: BC Managed Care – PPO | Admitting: Family Medicine

## 2020-05-18 ENCOUNTER — Other Ambulatory Visit: Payer: Self-pay

## 2020-05-18 VITALS — BP 147/91 | HR 85 | Temp 97.7°F | Ht 75.0 in | Wt 323.0 lb

## 2020-05-18 DIAGNOSIS — Z125 Encounter for screening for malignant neoplasm of prostate: Secondary | ICD-10-CM | POA: Diagnosis not present

## 2020-05-18 DIAGNOSIS — R7303 Prediabetes: Secondary | ICD-10-CM | POA: Diagnosis not present

## 2020-05-18 DIAGNOSIS — Z0001 Encounter for general adult medical examination with abnormal findings: Secondary | ICD-10-CM

## 2020-05-18 DIAGNOSIS — E785 Hyperlipidemia, unspecified: Secondary | ICD-10-CM

## 2020-05-18 DIAGNOSIS — E661 Drug-induced obesity: Secondary | ICD-10-CM

## 2020-05-18 DIAGNOSIS — Z23 Encounter for immunization: Secondary | ICD-10-CM | POA: Diagnosis not present

## 2020-05-18 DIAGNOSIS — E559 Vitamin D deficiency, unspecified: Secondary | ICD-10-CM | POA: Diagnosis not present

## 2020-05-18 DIAGNOSIS — Z6841 Body Mass Index (BMI) 40.0 and over, adult: Secondary | ICD-10-CM | POA: Insufficient documentation

## 2020-05-18 DIAGNOSIS — M545 Low back pain, unspecified: Secondary | ICD-10-CM

## 2020-05-18 DIAGNOSIS — E669 Obesity, unspecified: Secondary | ICD-10-CM

## 2020-05-18 NOTE — Assessment & Plan Note (Signed)
Check A1c.  Continue lifestyle modifications. 

## 2020-05-18 NOTE — Patient Instructions (Signed)
It was very nice to see you today!  We will check blood work today.  We will give your flu vaccine today.  I will see you back in year.  Please come back to see me sooner if needed.  Take care, Dr Jerline Pain  Please try these tips to maintain a healthy lifestyle:   Eat at least 3 REAL meals and 1-2 snacks per day.  Aim for no more than 5 hours between eating.  If you eat breakfast, please do so within one hour of getting up.    Each meal should contain half fruits/vegetables, one quarter protein, and one quarter carbs (no bigger than a computer mouse)   Cut down on sweet beverages. This includes juice, soda, and sweet tea.     Drink at least 1 glass of water with each meal and aim for at least 8 glasses per day   Exercise at least 150 minutes every week.    Preventive Care 18-67 Years Old, Male Preventive care refers to lifestyle choices and visits with your health care provider that can promote health and wellness. This includes:  A yearly physical exam. This is also called an annual well check.  Regular dental and eye exams.  Immunizations.  Screening for certain conditions.  Healthy lifestyle choices, such as eating a healthy diet, getting regular exercise, not using drugs or products that contain nicotine and tobacco, and limiting alcohol use. What can I expect for my preventive care visit? Physical exam Your health care provider will check:  Height and weight. These may be used to calculate body mass index (BMI), which is a measurement that tells if you are at a healthy weight.  Heart rate and blood pressure.  Your skin for abnormal spots. Counseling Your health care provider may ask you questions about:  Alcohol, tobacco, and drug use.  Emotional well-being.  Home and relationship well-being.  Sexual activity.  Eating habits.  Work and work Statistician. What immunizations do I need?  Influenza (flu) vaccine  This is recommended every  year. Tetanus, diphtheria, and pertussis (Tdap) vaccine  You may need a Td booster every 10 years. Varicella (chickenpox) vaccine  You may need this vaccine if you have not already been vaccinated. Zoster (shingles) vaccine  You may need this after age 4. Measles, mumps, and rubella (MMR) vaccine  You may need at least one dose of MMR if you were born in 1957 or later. You may also need a second dose. Pneumococcal conjugate (PCV13) vaccine  You may need this if you have certain conditions and were not previously vaccinated. Pneumococcal polysaccharide (PPSV23) vaccine  You may need one or two doses if you smoke cigarettes or if you have certain conditions. Meningococcal conjugate (MenACWY) vaccine  You may need this if you have certain conditions. Hepatitis A vaccine  You may need this if you have certain conditions or if you travel or work in places where you may be exposed to hepatitis A. Hepatitis B vaccine  You may need this if you have certain conditions or if you travel or work in places where you may be exposed to hepatitis B. Haemophilus influenzae type b (Hib) vaccine  You may need this if you have certain risk factors. Human papillomavirus (HPV) vaccine  If recommended by your health care provider, you may need three doses over 6 months. You may receive vaccines as individual doses or as more than one vaccine together in one shot (combination vaccines). Talk with your health care provider  about the risks and benefits of combination vaccines. What tests do I need? Blood tests  Lipid and cholesterol levels. These may be checked every 5 years, or more frequently if you are over 41 years old.  Hepatitis C test.  Hepatitis B test. Screening  Lung cancer screening. You may have this screening every year starting at age 10 if you have a 30-pack-year history of smoking and currently smoke or have quit within the past 15 years.  Prostate cancer screening.  Recommendations will vary depending on your family history and other risks.  Colorectal cancer screening. All adults should have this screening starting at age 59 and continuing until age 22. Your health care provider may recommend screening at age 82 if you are at increased risk. You will have tests every 1-10 years, depending on your results and the type of screening test.  Diabetes screening. This is done by checking your blood sugar (glucose) after you have not eaten for a while (fasting). You may have this done every 1-3 years.  Sexually transmitted disease (STD) testing. Follow these instructions at home: Eating and drinking  Eat a diet that includes fresh fruits and vegetables, whole grains, lean protein, and low-fat dairy products.  Take vitamin and mineral supplements as recommended by your health care provider.  Do not drink alcohol if your health care provider tells you not to drink.  If you drink alcohol: ? Limit how much you have to 0-2 drinks a day. ? Be aware of how much alcohol is in your drink. In the [place] one drink equals one 12 oz bottle of beer (355 mL), one 5 oz glass of wine (148 mL), or one 1 oz glass of hard liquor (44 mL). Lifestyle  Take daily care of your teeth and gums.  Stay active. Exercise for at least 30 minutes on 5 or more days each week.  Do not use any products that contain nicotine or tobacco, such as cigarettes, e-cigarettes, and chewing tobacco. If you need help quitting, ask your health care provider.  If you are sexually active, practice safe sex. Use a condom or other form of protection to prevent STIs (sexually transmitted infections).  Talk with your health care provider about taking a low-dose aspirin every day starting at age 17. What's next?  Go to your health care provider once a year for a well check visit.  Ask your health care provider how often you should have your eyes and teeth checked.  Stay up to date on all vaccines. This  information is not intended to replace advice given to you by your health care provider. Make sure you discuss any questions you have with your health care provider. Document Revised: 06/11/2018 Document Reviewed: 06/11/2018 Elsevier Patient Education  2020 [employer].

## 2020-05-18 NOTE — Progress Notes (Signed)
Chief Complaint:  Austin Hernandez is a 54 y.o. male who presents today for his annual comprehensive physical exam.    Assessment/Plan:  Chronic Problems Addressed Today: Dyslipidemia Check lipids.  Continue lifestyle modifications.  Prediabetes Check A1c.  Continue lifestyle modifications.  Recurrent low back pain No red flags.  Normal exam today.  He will typically see chiropractor work with masseuse which seems to help.  Discussed referral to PT or sports med however patient deferred for the time being.  He will let me know if flares up again in the future.   Body mass index is 40.37 kg/m. / Obese  BMI Metric Follow Up - 05/18/20 1538      BMI Metric Follow Up-Please document annually   BMI Metric Follow Up Education provided           Preventative Healthcare: Flu vaccine given today.  Check labs including CBC, CMET, TSH, lipid panel.  Also check PSA.  Had Cologuard last year.  Due again for next in 2023.  Up-to-date on Covid vaccine.  Patient Counseling(The following topics were reviewed and/or handout was given):  -Nutrition: Stressed importance of moderation in sodium/caffeine intake, saturated fat and cholesterol, caloric balance, sufficient intake of fresh fruits, vegetables, and fiber.  -Stressed the importance of regular exercise.   -Substance Abuse: Discussed cessation/primary prevention of tobacco, alcohol, or other drug use; driving or other dangerous activities under the influence; availability of treatment for abuse.   -Injury prevention: Discussed safety belts, safety helmets, smoke detector, smoking near bedding or upholstery.   -Sexuality: Discussed sexually transmitted diseases, partner selection, use of condoms, avoidance of unintended pregnancy and contraceptive alternatives.   -Dental health: Discussed importance of regular tooth brushing, flossing, and dental visits.  -Health maintenance and immunizations reviewed. Please refer to Health maintenance  section.  Return to care in 1 year for next preventative visit.     Subjective:  HPI:  He has no acute complaints today.   Lifestyle Diet: None specific.  Exercise: Limited. Likes walking the dog.   Depression screen PHQ 2/9 05/18/2020  Decreased Interest 0  Down, Depressed, Hopeless 0  PHQ - 2 Score 0  Altered sleeping -  Tired, decreased energy -  Change in appetite -  Feeling bad or failure about yourself  -  Trouble concentrating -  Moving slowly or fidgety/restless -  Suicidal thoughts -  PHQ-9 Score -    Health Maintenance Due  Topic Date Due  . INFLUENZA VACCINE  01/30/2020     ROS: Per HPI, otherwise a complete review of systems was negative.   PMH:  The following were reviewed and entered/updated in epic: Past Medical History:  Diagnosis Date  . Back pain   . GERD (gastroesophageal reflux disease)   . History of chicken pox   . HLD (hyperlipidemia)   . Joint pain   . OSA (obstructive sleep apnea) 12/11/2016  . Restless leg   . Seasonal allergies   . Sleep apnea    Patient Active Problem List   Diagnosis Date Noted  . Recurrent low back pain 05/18/2020  . OSA (obstructive sleep apnea) 12/11/2016  . Prediabetes 12/09/2016  . History of cold sores 03/28/2008  . Dyslipidemia 03/28/2008  . Allergic rhinitis 03/28/2008   Past Surgical History:  Procedure Laterality Date  . ADENOIDECTOMY  1970  . ANKLE SURGERY  1980s   torn ligaments; bilateral; separate surgeries (4 total), sports  . UMBILICAL HERNIA REPAIR      Family History  Problem  Relation Age of Onset  . Arthritis Mother   . Hypertension Mother   . Hyperlipidemia Mother   . Squamous cell carcinoma Father   . Cancer Father 36       throat cancer (squamous), smoker  . Stroke Maternal Grandmother 50  . Diabetes Maternal Grandmother   . Cancer Paternal Grandfather        lung?  . Coronary artery disease Neg Hx     Medications- reviewed and updated Current Outpatient Medications    Medication Sig Dispense Refill  . metFORMIN (GLUCOPHAGE) 500 MG tablet TAKE 1 TABLET BY MOUTH EVERY DAY WITH BREAKFAST 90 tablet 0  . valACYclovir (VALTREX) 500 MG tablet Take 1 tablet (500 mg total) by mouth 2 (two) times daily. 30 tablet 0   No current facility-administered medications for this visit.    Allergies-reviewed and updated No Known Allergies  Social History   Socioeconomic History  . Marital status: Single    Spouse name: Not on file  . Number of children: Not on file  . Years of education: Not on file  . Highest education level: Not on file  Occupational History  . Occupation: GENERAL MANAGER [employer]  Tobacco Use  . Smoking status: Former Smoker    Packs/day: 1.00    Years: 25.00    Pack years: 25.00    Types: Cigarettes    Quit date: 07/01/2010    Years since quitting: 9.8  . Smokeless tobacco: Never Used  Vaping Use  . Vaping Use: Never used  Substance and Sexual Activity  . Alcohol use: Yes    Alcohol/week: 5.0 - 6.0 standard drinks    Types: 5 - 6 Glasses of wine per week    Comment: Occasional (weekends)  . Drug use: No  . Sexual activity: Not on file  Other Topics Concern  . 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  Some fruit/vegetables.   Social Determinants of Health   Financial Resource Strain:   . Difficulty of Paying Living Expenses: Not on file  Food Insecurity:   . Worried About Programme researcher, broadcasting/film/video in the Last Year: Not on file  . Ran Out of Food in the Last Year: Not on file  Transportation Needs:   . Lack of Transportation (Medical): Not on file  . Lack of Transportation (Non-Medical): Not on file  Physical Activity:   . Days of Exercise per Week: Not on file  . Minutes of Exercise per Session: Not on file  Stress:   . Feeling of Stress : Not  on file  Social Connections:   . Frequency of Communication with Friends and Family: Not on file  . Frequency of Social Gatherings with Friends and Family: Not on file  . Attends Religious Services: Not on file  . Active Member of Clubs or Organizations: Not on file  . Attends Banker Meetings: Not on file  . Marital Status: Not on file        Objective:  Physical Exam: BP (!) 147/91   Pulse 85   Temp 97.7 F (36.5 C) (Temporal)   Ht 6\' 3"  (1.905 m)   Wt (!) 323 lb (146.5 kg)   SpO2 96%   BMI 40.37 kg/m   Body mass index is 40.37 kg/m. Wt  Readings from Last 3 Encounters:  05/18/20 (!) 323 lb (146.5 kg)  09/15/18 (!) 311 lb 3.2 oz (141.2 kg)  07/16/18 (!) 312 lb 6.1 oz (141.7 kg)   Gen: NAD, resting comfortably HEENT: TMs normal bilaterally. OP clear. No thyromegaly noted.  CV: RRR with no murmurs appreciated Pulm: NWOB, CTAB with no crackles, wheezes, or rhonchi GI: Normal bowel sounds present. Soft, Nontender, Nondistended. MSK: no edema, cyanosis, or clubbing noted Skin: warm, dry Neuro: CN2-12 grossly intact. Strength 5/5 in upper and lower extremities. Reflexes symmetric and intact bilaterally.  Psych: Normal affect and thought content     Austin Maestas M. [name], MD 05/18/2020 3:39 PM

## 2020-05-18 NOTE — Assessment & Plan Note (Signed)
No red flags.  Normal exam today.  He will typically see chiropractor work with masseuse which seems to help.  Discussed referral to PT or sports med however patient deferred for the time being.  He will let me know if flares up again in the future.

## 2020-05-18 NOTE — Assessment & Plan Note (Signed)
Check lipids.  Continue lifestyle modifications. 

## 2020-05-19 LAB — COMPREHENSIVE METABOLIC PANEL
AG Ratio: 1.5 (calc) (ref 1.0–2.5)
ALT: 160 U/L — ABNORMAL HIGH (ref 9–46)
AST: 93 U/L — ABNORMAL HIGH (ref 10–35)
Albumin: 4 g/dL (ref 3.6–5.1)
Alkaline phosphatase (APISO): 74 U/L (ref 35–144)
BUN: 17 mg/dL (ref 7–25)
CO2: 25 mmol/L (ref 20–32)
Calcium: 9.1 mg/dL (ref 8.6–10.3)
Chloride: 106 mmol/L (ref 98–110)
Creat: 0.96 mg/dL (ref 0.70–1.33)
Globulin: 2.7 g/dL (calc) (ref 1.9–3.7)
Glucose, Bld: 83 mg/dL (ref 65–99)
Potassium: 3.8 mmol/L (ref 3.5–5.3)
Sodium: 141 mmol/L (ref 135–146)
Total Bilirubin: 0.4 mg/dL (ref 0.2–1.2)
Total Protein: 6.7 g/dL (ref 6.1–8.1)

## 2020-05-19 LAB — HEMOGLOBIN A1C
Hgb A1c MFr Bld: 7.3 % of total Hgb — ABNORMAL HIGH (ref <5.7)
Mean Plasma Glucose: 163 (calc)
eAG (mmol/L): 9 (calc)

## 2020-05-19 LAB — LIPID PANEL
Cholesterol: 263 mg/dL — ABNORMAL HIGH (ref <200)
HDL: 47 mg/dL (ref >=40)
LDL Cholesterol (Calc): 186 mg/dL (calc) — ABNORMAL HIGH
Non-HDL Cholesterol (Calc): 216 mg/dL (calc) — ABNORMAL HIGH (ref <130)
Total CHOL/HDL Ratio: 5.6 (calc) — ABNORMAL HIGH (ref <5.0)
Triglycerides: 157 mg/dL — ABNORMAL HIGH (ref <150)

## 2020-05-19 LAB — CBC
HCT: 46.6 % (ref 38.5–50.0)
Hemoglobin: 16.2 g/dL (ref 13.2–17.1)
MCH: 32.3 pg (ref 27.0–33.0)
MCHC: 34.8 g/dL (ref 32.0–36.0)
MCV: 93 fL (ref 80.0–100.0)
MPV: 10.1 fL (ref 7.5–12.5)
Platelets: 180 10*3/uL (ref 140–400)
RBC: 5.01 10*6/uL (ref 4.20–5.80)
RDW: 13.1 % (ref 11.0–15.0)
WBC: 8.2 10*3/uL (ref 3.8–10.8)

## 2020-05-19 LAB — PSA: PSA: 0.11 ng/mL (ref <=4.0)

## 2020-05-19 LAB — TSH: TSH: 4.32 mIU/L (ref 0.40–4.50)

## 2020-05-22 NOTE — Progress Notes (Signed)
Please inform patient of the following:  Liver numbers are way up. Recommend that he come back soon to recheck. Please place future order for CMET.   Cholesterol and blood sugar numbers are elevated. He will likely need to start medications but we need to check his liver numbers again first.   Everything else is stable.

## 2020-05-23 ENCOUNTER — Other Ambulatory Visit: Payer: Self-pay

## 2020-05-23 DIAGNOSIS — R7989 Other specified abnormal findings of blood chemistry: Secondary | ICD-10-CM

## 2020-06-10 ENCOUNTER — Other Ambulatory Visit: Payer: Self-pay | Admitting: Family Medicine

## 2020-06-10 DIAGNOSIS — R7303 Prediabetes: Secondary | ICD-10-CM

## 2020-06-11 MED ORDER — METFORMIN HCL 500 MG PO TABS: 500.0000 mg | ORAL_TABLET | Freq: Every day | ORAL | 0 refills | Status: DC

## 2020-06-11 MED ORDER — VALACYCLOVIR HCL 500 MG PO TABS
500.0000 mg | ORAL_TABLET | Freq: Two times a day (BID) | ORAL | 0 refills | Status: DC
Start: 2020-06-11 — End: 2020-10-13

## 2020-06-14 ENCOUNTER — Other Ambulatory Visit: Payer: Self-pay

## 2020-06-20 ENCOUNTER — Other Ambulatory Visit: Payer: Self-pay | Admitting: Family Medicine

## 2020-08-11 ENCOUNTER — Telehealth: Payer: Self-pay

## 2020-08-11 NOTE — Telephone Encounter (Signed)
FYI

## 2020-08-11 NOTE — Telephone Encounter (Signed)
Pt called reporting he tested positive for covid as of yesterday evening. Pt states he feels fine besides stuffy nose.

## 2020-08-16 ENCOUNTER — Telehealth: Payer: Self-pay

## 2020-08-16 ENCOUNTER — Encounter: Payer: Self-pay | Admitting: Family Medicine

## 2020-08-16 NOTE — Telephone Encounter (Signed)
Patient received his Pfizer Covid shot on October 02 2019 and the 2nd on Oct 30 2019 and the Booster on July 10 2020.

## 2020-08-16 NOTE — Telephone Encounter (Signed)
Noted  

## 2020-08-16 NOTE — Telephone Encounter (Signed)
FYI

## 2020-08-16 NOTE — Telephone Encounter (Signed)
Patient calling in and wants in his chart to state that he tested postive for covid on 08/09/20.

## 2020-08-21 ENCOUNTER — Encounter: Payer: Self-pay | Admitting: Family Medicine

## 2020-08-21 ENCOUNTER — Telehealth (INDEPENDENT_AMBULATORY_CARE_PROVIDER_SITE_OTHER): Payer: BC Managed Care – PPO | Admitting: Family Medicine

## 2020-08-21 VITALS — Ht 75.0 in | Wt 300.0 lb

## 2020-08-21 DIAGNOSIS — U071 COVID-19: Secondary | ICD-10-CM

## 2020-08-21 LAB — SARS CORONAVIRUS 2: Coronavirus Source: POSITIVE

## 2020-08-21 NOTE — Progress Notes (Signed)
   Austin Hernandez is a 55 y.o. male who presents today for a virtual office visit.  Assessment/Plan:  COVID 19 Thankfully symptoms were very mild and have essentially resolved.  We will continue with watchful waiting.  Can continue over-the-counter meds as needed.    Subjective:  HPI:    Patient with symptoms 12 days ago. Symptoms were very mild. Took a test 12 days ago which was positive. Symptoms have resolved.        Objective/Observations  Physical Exam: Gen: NAD, resting comfortably Pulm: Normal work of breathing Neuro: Grossly normal, moves all extremities Psych: Normal affect and thought content  Virtual Visit via Video   I connected with Austin Hernandez on 08/21/20 at 11:00 AM EST by a video enabled telemedicine application and verified that I am speaking with the correct person using two identifiers. The limitations of evaluation and management by telemedicine and the availability of in person appointments were discussed. The patient expressed understanding and agreed to proceed.   Patient location: Home Provider location: [place] [facility] [employer] Persons participating in the virtual visit: Myself and Patient     Katina Degree. [name], MD 08/21/2020 11:18 AM

## 2020-09-19 DIAGNOSIS — M9903 Segmental and somatic dysfunction of lumbar region: Secondary | ICD-10-CM | POA: Diagnosis not present

## 2020-09-19 DIAGNOSIS — M9905 Segmental and somatic dysfunction of pelvic region: Secondary | ICD-10-CM | POA: Diagnosis not present

## 2020-09-19 DIAGNOSIS — M5451 Vertebrogenic low back pain: Secondary | ICD-10-CM | POA: Diagnosis not present

## 2020-09-19 DIAGNOSIS — M25551 Pain in right hip: Secondary | ICD-10-CM | POA: Diagnosis not present

## 2020-09-22 DIAGNOSIS — M25551 Pain in right hip: Secondary | ICD-10-CM | POA: Diagnosis not present

## 2020-09-22 DIAGNOSIS — M9903 Segmental and somatic dysfunction of lumbar region: Secondary | ICD-10-CM | POA: Diagnosis not present

## 2020-09-22 DIAGNOSIS — M5451 Vertebrogenic low back pain: Secondary | ICD-10-CM | POA: Diagnosis not present

## 2020-09-22 DIAGNOSIS — M9905 Segmental and somatic dysfunction of pelvic region: Secondary | ICD-10-CM | POA: Diagnosis not present

## 2020-09-22 DIAGNOSIS — M546 Pain in thoracic spine: Secondary | ICD-10-CM | POA: Diagnosis not present

## 2020-09-22 DIAGNOSIS — M461 Sacroiliitis, not elsewhere classified: Secondary | ICD-10-CM | POA: Diagnosis not present

## 2020-09-29 DIAGNOSIS — M9905 Segmental and somatic dysfunction of pelvic region: Secondary | ICD-10-CM | POA: Diagnosis not present

## 2020-09-29 DIAGNOSIS — M5451 Vertebrogenic low back pain: Secondary | ICD-10-CM | POA: Diagnosis not present

## 2020-09-29 DIAGNOSIS — M25551 Pain in right hip: Secondary | ICD-10-CM | POA: Diagnosis not present

## 2020-09-29 DIAGNOSIS — M9903 Segmental and somatic dysfunction of lumbar region: Secondary | ICD-10-CM | POA: Diagnosis not present

## 2020-09-29 DIAGNOSIS — M9906 Segmental and somatic dysfunction of lower extremity: Secondary | ICD-10-CM | POA: Diagnosis not present

## 2020-10-02 ENCOUNTER — Ambulatory Visit: Payer: Self-pay

## 2020-10-02 ENCOUNTER — Ambulatory Visit (INDEPENDENT_AMBULATORY_CARE_PROVIDER_SITE_OTHER): Payer: BC Managed Care – PPO | Admitting: Orthopedic Surgery

## 2020-10-02 DIAGNOSIS — M545 Low back pain, unspecified: Secondary | ICD-10-CM

## 2020-10-02 DIAGNOSIS — M79604 Pain in right leg: Secondary | ICD-10-CM

## 2020-10-03 DIAGNOSIS — M9905 Segmental and somatic dysfunction of pelvic region: Secondary | ICD-10-CM | POA: Diagnosis not present

## 2020-10-03 DIAGNOSIS — M9906 Segmental and somatic dysfunction of lower extremity: Secondary | ICD-10-CM | POA: Diagnosis not present

## 2020-10-03 DIAGNOSIS — M5451 Vertebrogenic low back pain: Secondary | ICD-10-CM | POA: Diagnosis not present

## 2020-10-03 DIAGNOSIS — M25551 Pain in right hip: Secondary | ICD-10-CM | POA: Diagnosis not present

## 2020-10-03 DIAGNOSIS — M9903 Segmental and somatic dysfunction of lumbar region: Secondary | ICD-10-CM | POA: Diagnosis not present

## 2020-10-04 DIAGNOSIS — M9905 Segmental and somatic dysfunction of pelvic region: Secondary | ICD-10-CM | POA: Diagnosis not present

## 2020-10-04 DIAGNOSIS — M25551 Pain in right hip: Secondary | ICD-10-CM | POA: Diagnosis not present

## 2020-10-04 DIAGNOSIS — M5451 Vertebrogenic low back pain: Secondary | ICD-10-CM | POA: Diagnosis not present

## 2020-10-04 DIAGNOSIS — M9903 Segmental and somatic dysfunction of lumbar region: Secondary | ICD-10-CM | POA: Diagnosis not present

## 2020-10-05 ENCOUNTER — Encounter: Payer: Self-pay | Admitting: Orthopedic Surgery

## 2020-10-05 NOTE — Progress Notes (Signed)
Office Visit Note   Patient: Austin Hernandez           Date of Birth: Feb 12, 1966           MRN: [id number] Visit Date: 10/02/2020 Requested by: Ardith Dark, MD [address] [place],  [place] [address] PCP: Ardith Dark, MD  Subjective: Chief Complaint  Patient presents with  . Right Leg - Pain  . Right Knee - Pain    HPI: Patient presents with right knee pain of 2 months duration as well as low back pain.  Describes right knee pain after walking [place] the beach.  Denies that the pain wakes him from sleep at night.  Denies much in the way of mechanical symptoms.  Did play a lot of sports in [place] when he was younger.  Takes Advil for his symptoms.  Reports medial sided pain.  Feels like he will hyperextend when he walks.  Does have some difficulty with stairs.  He also reports more radicular type pain radiating down from the back into the toes on the right-hand side.  Describes buttock pain as well.  This is more symptomatic than his knee pain.  Its sciatic type pain started about 2 weeks after his knee.  Does have a history of L-spine MRI 2 years ago.  He describes decreased muscle strength in his legs.  No prior surgery in the right knee or back.              ROS: All systems reviewed are negative as they relate to the chief complaint within the history of present illness.  Patient denies  fevers or chills.   Assessment & Plan: Visit Diagnoses:  1. Pain in right leg     Plan: Impression is right sided radicular pain of 2 months duration with failure of conservative management.  He is having sciatic type symptoms with some subjective weakness.  His knee examination is benign.  Degenerative meniscal tear is possible but with no effusion and no joint line tenderness becomes less likely.  His main problem now appears to be radicular symptoms from the back.  Due to failure of conservative management and duration of symptoms MRI scan of the lumbar spine is indicated with likely epidural  steroid injections to follow.  Continue with Advil and avoid heavy lifting.  We will arrange for follow-up with Dr. Alvester Morin after his lumbar spine MRI.  Could consider knee work-up in the future if the back scan does not show localizing right-sided causes for his pain.  Follow-Up Instructions: No follow-ups on file.   Orders:  Orders Placed This Encounter  Procedures  . XR KNEE 3 VIEW RIGHT  . XR Lumbar Spine 2-3 Views  . MR Lumbar Spine w/o contrast  . Ambulatory referral to Physical Medicine Rehab   No orders of the defined types were placed in this encounter.     Procedures: No procedures performed   Clinical Data: No additional findings.  Objective: Vital Signs: There were no vitals taken for this visit.  Physical Exam:   Constitutional: Patient appears well-developed HEENT:  Head: Normocephalic Eyes:EOM are normal Neck: Normal range of motion Cardiovascular: Normal rate Pulmonary/chest: Effort normal Neurologic: Patient is alert Skin: Skin is warm Psychiatric: Patient has normal mood and affect    Ortho Exam: Ortho exam demonstrates full active and passive range of motion of the right and left knee.  There is no effusion.  Collateral and cruciate ligaments are stable.  No focal joint line tenderness  is present.  No masses lymphadenopathy or skin changes noted in that right knee.  Does have positive nerve root tension signs on the right negative on the left.  Range of motion is symmetric and full without hyperextension in both knees.  Mild paresthesias L5 distribution right versus left.  5 out of 5 ankle dorsiflexion plantarflexion quad hamstring and hip flexion strength bilaterally.  He is somewhat inflexible and does have pain with forward flexion and extension of the back.  No discrete trochanteric tenderness is noted.  Specialty Comments:  No specialty comments available.  Imaging: No results found.   PMFS History: Patient Active Problem List   Diagnosis  Date Noted  . Recurrent low back pain 05/18/2020  . OSA (obstructive sleep apnea) 12/11/2016  . Prediabetes 12/09/2016  . History of cold sores 03/28/2008  . Dyslipidemia 03/28/2008  . Allergic rhinitis 03/28/2008   Past Medical History:  Diagnosis Date  . Back pain   . GERD (gastroesophageal reflux disease)   . History of chicken pox   . HLD (hyperlipidemia)   . Joint pain   . OSA (obstructive sleep apnea) 12/11/2016  . Restless leg   . Seasonal allergies   . Sleep apnea     Family History  Problem Relation Age of Onset  . Arthritis Mother   . Hypertension Mother   . Hyperlipidemia Mother   . Squamous cell carcinoma Father   . Cancer Father 61       throat cancer (squamous), smoker  . Stroke Maternal Grandmother 50  . Diabetes Maternal Grandmother   . Cancer Paternal Grandfather        lung?  . Coronary artery disease Neg Hx     Past Surgical History:  Procedure Laterality Date  . ADENOIDECTOMY  1970  . ANKLE SURGERY  1980s   torn ligaments; bilateral; separate surgeries (4 total), sports  . UMBILICAL HERNIA REPAIR     Social History   Occupational History  . Occupation: GENERAL MANAGER [employer]  Tobacco Use  . Smoking status: Former Smoker    Packs/day: 1.00    Years: 25.00    Pack years: 25.00    Types: Cigarettes    Quit date: 07/01/2010    Years since quitting: 10.2  . Smokeless tobacco: Never Used  Vaping Use  . Vaping Use: Never used  Substance and Sexual Activity  . Alcohol use: Yes    Alcohol/week: 5.0 - 6.0 standard drinks    Types: 5 - 6 Glasses of wine per week    Comment: Occasional (weekends)  . Drug use: No  . Sexual activity: Not on file

## 2020-10-06 DIAGNOSIS — M9903 Segmental and somatic dysfunction of lumbar region: Secondary | ICD-10-CM | POA: Diagnosis not present

## 2020-10-06 DIAGNOSIS — M9905 Segmental and somatic dysfunction of pelvic region: Secondary | ICD-10-CM | POA: Diagnosis not present

## 2020-10-06 DIAGNOSIS — M5451 Vertebrogenic low back pain: Secondary | ICD-10-CM | POA: Diagnosis not present

## 2020-10-06 DIAGNOSIS — M25551 Pain in right hip: Secondary | ICD-10-CM | POA: Diagnosis not present

## 2020-10-09 ENCOUNTER — Encounter: Payer: Self-pay | Admitting: Orthopedic Surgery

## 2020-10-09 DIAGNOSIS — M79604 Pain in right leg: Secondary | ICD-10-CM

## 2020-10-09 NOTE — Telephone Encounter (Signed)
Cannot really do both at the same time.  He would have to obtain another scheduling slot.  It really seemed that his pain was more radicular in nature.  He has a fairly high incidence of having asymptomatic degenerative meniscal pathology so I think would be more reasonable to inject the knee on his return visit and see how he does with that as opposed to ordering a scan right away which may show something that would otherwise get better on its own.

## 2020-10-10 ENCOUNTER — Telehealth (INDEPENDENT_AMBULATORY_CARE_PROVIDER_SITE_OTHER): Payer: BC Managed Care – PPO | Admitting: Family Medicine

## 2020-10-10 VITALS — Ht 75.0 in | Wt 300.0 lb

## 2020-10-10 DIAGNOSIS — M5451 Vertebrogenic low back pain: Secondary | ICD-10-CM | POA: Diagnosis not present

## 2020-10-10 DIAGNOSIS — M9903 Segmental and somatic dysfunction of lumbar region: Secondary | ICD-10-CM | POA: Diagnosis not present

## 2020-10-10 DIAGNOSIS — M25551 Pain in right hip: Secondary | ICD-10-CM | POA: Diagnosis not present

## 2020-10-10 DIAGNOSIS — M545 Low back pain, unspecified: Secondary | ICD-10-CM | POA: Diagnosis not present

## 2020-10-10 DIAGNOSIS — M9905 Segmental and somatic dysfunction of pelvic region: Secondary | ICD-10-CM | POA: Diagnosis not present

## 2020-10-10 MED ORDER — GABAPENTIN 300 MG PO CAPS: 300.0000 mg | ORAL_CAPSULE | Freq: Every day | ORAL | 3 refills | Status: DC

## 2020-10-10 NOTE — Assessment & Plan Note (Signed)
Symptoms worsened since our last visit.  Now having right-sided sciatica.  Has been following with orthopedics and has MRI upcoming.  Symptoms are not controlled.  Has been taking ibuprofen with modest improvement.  We will start gabapentin 300 mg nightly and titrate dose as needed.  He will check in with me in a few days.  May consider trial of anti-inflammatory or muscle relaxers depending on response to gabapentin.

## 2020-10-10 NOTE — Progress Notes (Signed)
   Austin Hernandez is a 55 y.o. male who presents today for a virtual office visit.  Assessment/Plan:  Chronic Problems Addressed Today: Recurrent low back pain Symptoms worsened since our last visit.  Now having right-sided sciatica.  Has been following with orthopedics and has MRI upcoming.  Symptoms are not controlled.  Has been taking ibuprofen with modest improvement.  We will start gabapentin 300 mg nightly and titrate dose as needed.  He will check in with me in a few days.  May consider trial of anti-inflammatory or muscle relaxers depending on response to gabapentin.     Subjective:  HPI:  See a/p.         Objective/Observations  Physical Exam: Gen: NAD, resting comfortably Pulm: Normal work of breathing Neuro: Grossly normal, moves all extremities Psych: Normal affect and thought content  Virtual Visit via Video   I connected with Austin Hernandez on 10/10/20 at  3:40 PM EDT by a video enabled telemedicine application and verified that I am speaking with the correct person using two identifiers. The limitations of evaluation and management by telemedicine and the availability of in person appointments were discussed. The patient expressed understanding and agreed to proceed.   Patient location: Home Provider location: [place] [facility] [employer] Persons participating in the virtual visit: Myself and Patient     Katina Degree. [name], MD 10/10/2020 3:48 PM

## 2020-10-13 ENCOUNTER — Other Ambulatory Visit: Payer: Self-pay | Admitting: Family Medicine

## 2020-10-13 DIAGNOSIS — R7303 Prediabetes: Secondary | ICD-10-CM

## 2020-10-16 MED ORDER — METFORMIN HCL 500 MG PO TABS: 500.0000 mg | ORAL_TABLET | Freq: Every day | ORAL | 0 refills | Status: DC

## 2020-10-16 MED ORDER — VALACYCLOVIR HCL 500 MG PO TABS
500.0000 mg | ORAL_TABLET | Freq: Two times a day (BID) | ORAL | 0 refills | Status: DC
Start: 2020-10-16 — End: 2020-10-25

## 2020-10-25 ENCOUNTER — Ambulatory Visit
Admission: RE | Admit: 2020-10-25 | Discharge: 2020-10-25 | Disposition: A | Discharge status: Home / Self Care | Payer: BC Managed Care – PPO | Source: Ambulatory Visit | Attending: Orthopedic Surgery | Admitting: Orthopedic Surgery

## 2020-10-25 ENCOUNTER — Other Ambulatory Visit: Payer: Self-pay

## 2020-10-25 ENCOUNTER — Other Ambulatory Visit: Payer: Self-pay | Admitting: Family Medicine

## 2020-10-25 DIAGNOSIS — M79604 Pain in right leg: Secondary | ICD-10-CM

## 2020-10-25 DIAGNOSIS — M545 Low back pain, unspecified: Secondary | ICD-10-CM | POA: Diagnosis not present

## 2020-10-26 ENCOUNTER — Other Ambulatory Visit: Payer: Self-pay

## 2020-10-26 DIAGNOSIS — M79604 Pain in right leg: Secondary | ICD-10-CM

## 2020-10-26 NOTE — Telephone Encounter (Signed)
Scan shows foraminal stenosis at L4-5.  I think he should come in and get an injection with Dr. Alvester Morin.  Then we can inject the knee and get an MRI scan on the knee if that back injection does not help.  Please arrange for lumbar ESI with Dr. Alvester Morin next available.  Thanks and please call this person thanks

## 2020-10-27 ENCOUNTER — Telehealth: Payer: Self-pay | Admitting: Orthopedic Surgery

## 2020-10-27 NOTE — Telephone Encounter (Signed)
Patient asked to send reminder to dr. August Saucer to call and discuss MRI results. Please call patient at [phone]

## 2020-10-27 NOTE — Telephone Encounter (Signed)
See below. Patient asking again for results.

## 2020-10-28 ENCOUNTER — Encounter: Payer: Self-pay | Admitting: Family Medicine

## 2020-10-30 NOTE — Telephone Encounter (Signed)
Hi Lauren.  I called Austin Hernandez.  Please see if actually called him 4 days ago.  Also please set him up for vascular surgery consult in [facility] for his iliac artery aneurysms.  Typically no action required if they are less than 3 cm but he wants to go ahead and get him lined for that.  And I think that he is seeing me next week for back injections.  I do think he will do future care in [facility].

## 2020-10-30 NOTE — Telephone Encounter (Signed)
Please advise 

## 2020-10-31 ENCOUNTER — Other Ambulatory Visit: Payer: Self-pay | Admitting: *Deleted

## 2020-11-10 ENCOUNTER — Other Ambulatory Visit: Payer: Self-pay | Admitting: Family Medicine

## 2020-11-13 ENCOUNTER — Ambulatory Visit: Payer: Self-pay

## 2020-11-13 ENCOUNTER — Ambulatory Visit (INDEPENDENT_AMBULATORY_CARE_PROVIDER_SITE_OTHER): Payer: BC Managed Care – PPO | Admitting: Physical Medicine and Rehabilitation

## 2020-11-13 ENCOUNTER — Other Ambulatory Visit: Payer: Self-pay

## 2020-11-13 ENCOUNTER — Encounter: Payer: Self-pay | Admitting: Physical Medicine and Rehabilitation

## 2020-11-13 VITALS — BP 137/93 | HR 88

## 2020-11-13 DIAGNOSIS — M5416 Radiculopathy, lumbar region: Secondary | ICD-10-CM | POA: Diagnosis not present

## 2020-11-13 MED ORDER — BETAMETHASONE SOD PHOS & ACET 6 (3-3) MG/ML IJ SUSP
12.0000 mg | Freq: Once | INTRAMUSCULAR | Status: AC
  Administered 2020-11-13: 12 mg

## 2020-11-13 NOTE — Progress Notes (Signed)
Pt state lower back pain that travels down his right leg and foot. Pt state he feels numbness in his right foot and pt mention he has trouble walking. Pt state he takes pain meds to help ease his pain.  Numeric Pain Rating Scale and Functional Assessment Average Pain 3   In the last MONTH (on 0-10 scale) has pain interfered with the following?  1. General activity like being  able to carry out your everyday physical activities such as walking, climbing stairs, carrying groceries, or moving a chair?  Rating(7)   +Driver, -BT, -Dye Allergies.

## 2020-11-13 NOTE — Progress Notes (Signed)
Austin Hernandez - 55 y.o. male MRN [id number]  Date of birth: 08/14/1965  Office Visit Note: Visit Date: 11/13/2020 PCP: Ardith Dark, MD Referred by: Ardith Dark, MD  Subjective: Chief Complaint  Patient presents with  . Lower Back - Pain  . Right Leg - Pain  . Right Foot - Weakness   HPI:  Austin Hernandez is a 55 y.o. male who comes in today at the request of Dr. Burnard Bunting for planned Right L4-L5 Lumbar Interlaminar epidural steroid injection with fluoroscopic guidance.  The patient has failed conservative care including home exercise, medications, time and activity modification.  This injection will be diagnostic and hopefully therapeutic.  Please see requesting physician notes for further details and justification. MRI reviewed with images and spine model.  MRI reviewed in the note below.  MRI does show arthritic change at L4-5 with lateral recess narrowing actually left more than right but also with central canal narrowing do to epidural lipomatosis.   ROS Otherwise per HPI.  Assessment & Plan: Visit Diagnoses:    ICD-10-CM   1. Lumbar radiculopathy  M54.16 XR C-ARM NO REPORT    Epidural Steroid injection    betamethasone acetate-betamethasone sodium phosphate (CELESTONE) injection 12 mg    Plan: No additional findings.   Meds & Orders:  Meds ordered this encounter  Medications  . betamethasone acetate-betamethasone sodium phosphate (CELESTONE) injection 12 mg    Orders Placed This Encounter  Procedures  . XR C-ARM NO REPORT  . Epidural Steroid injection    Follow-up: Return if symptoms worsen or fail to improve.   Procedures: No procedures performed  Lumbar Epidural Steroid Injection - Interlaminar Approach with Fluoroscopic Guidance  Patient: Austin Hernandez      Date of Birth: [birth year] MRN: [id number] PCP: Ardith Dark, MD      Visit Date: 11/13/2020   Universal Protocol:     Consent Given By: the patient  Position: PRONE  Additional  Comments: Vital signs were monitored before and after the procedure. Patient was prepped and draped in the usual sterile fashion. The correct patient, procedure, and site was verified.   Injection Procedure Details:   Procedure diagnoses: Lumbar radiculopathy [M54.16]   Meds Administered:  Meds ordered this encounter  Medications  . betamethasone acetate-betamethasone sodium phosphate (CELESTONE) injection 12 mg     Laterality: Right  Location/Site:  L4-L5  Needle: 3.5 in., 20 ga. Tuohy  Needle Placement: Paramedian epidural  Findings:   -Comments: Excellent flow of contrast into the epidural space.  Procedure Details: Using a paramedian approach from the side mentioned above, the region overlying the inferior lamina was localized under fluoroscopic visualization and the soft tissues overlying this structure were infiltrated with 4 ml. of 1% Lidocaine without Epinephrine. The Tuohy needle was inserted into the epidural space using a paramedian approach.   The epidural space was localized using loss of resistance along with counter oblique bi-planar fluoroscopic views.  After negative aspirate for air, blood, and CSF, a 2 ml. volume of Isovue-250 was injected into the epidural space and the flow of contrast was observed. Radiographs were obtained for documentation purposes.    The injectate was administered into the level noted above.   Additional Comments:  The patient tolerated the procedure well Dressing: 2 x 2 sterile gauze and Band-Aid    Post-procedure details: Patient was observed during the procedure. Post-procedure instructions were reviewed.  Patient left the clinic in stable condition.     Clinical History: Pain  right leg.  Lumbar radiculopathy.  EXAM: MRI LUMBAR SPINE WITHOUT CONTRAST  TECHNIQUE: Multiplanar, multisequence MR imaging of the lumbar spine was performed. No intravenous contrast was administered.  COMPARISON:  Radiographs October 02, 2020.  FINDINGS: Segmentation:  Standard.  Alignment:  Maintained  Vertebrae:  No fracture, evidence of discitis, or bone lesion.  Conus medullaris and cauda equina: Conus extends to the L1 level. Conus and cauda equina appear normal.  Paraspinal and other soft tissues: Common iliac arteries aneurysms measuring 2.5 cm on the right and 2.8 cm on the left.  Disc levels:  T12-L1: No spinal canal or neural foraminal stenosis.  L1-2: Loss of disc height, shallow disc bulge. No spinal canal or neural foraminal stenosis.  L2-3: Loss of disc height, shallow disc bulge and mild facet degenerative changes without significant spinal canal or neural foraminal stenosis.  L3-4: Shallow disc bulge and mild facet degenerative changes without significant spinal canal or neural foraminal stenosis.  L4-5: Loss of disc height, disc bulge, mild facet degenerative changes and prominence of the epidural fat resulting in mild-to-moderate narrowing of the thecal sac and mild-to-moderate bilateral neural foraminal narrowing.  L5-S1: Loss of disc height, disc bulge with associated osteophytic component and mild facet degenerative changes resulting mild right and moderate left neural foraminal. No spinal canal stenosis.  IMPRESSION: 1. Multilevel degenerative changes of the lumbar spine as described, most pronounced at L4-5 where there is mild-to-moderate narrowing of the thecal sac and mild-to-moderate bilateral neural foraminal narrowing. 2. Mild right and moderate left neural foraminal narrowing at L5-S1. 3. Common iliac arteries aneurysms measuring 2.5 cm on the right and 2.8 cm on the left.   Electronically Signed   By: Baldemar Lenis M.D.   On: 10/25/2020 16:49     Objective:  VS:  HT:    WT:   BMI:     BP:(!) 137/93  HR:88bpm  TEMP: ( )  RESP:  Physical Exam Vitals and nursing note reviewed.  Constitutional:      General: He is not in acute  distress.    Appearance: Normal appearance. He is obese. He is not ill-appearing.  HENT:     Head: Normocephalic and atraumatic.     Right Ear: External ear normal.     Left Ear: External ear normal.     Nose: No congestion.  Eyes:     Extraocular Movements: Extraocular movements intact.  Cardiovascular:     Rate and Rhythm: Normal rate.     Pulses: Normal pulses.  Pulmonary:     Effort: Pulmonary effort is normal. No respiratory distress.  Abdominal:     General: There is no distension.     Palpations: Abdomen is soft.  Musculoskeletal:        General: No tenderness or signs of injury.     Cervical back: Neck supple.     Right lower leg: No edema.     Left lower leg: No edema.     Comments: Patient has good distal strength without clonus.  Skin:    Findings: No erythema or rash.  Neurological:     General: No focal deficit present.     Mental Status: He is alert and oriented to person, place, and time.     Sensory: No sensory deficit.     Motor: No weakness or abnormal muscle tone.     Coordination: Coordination normal.  Psychiatric:        Mood and Affect: Mood normal.  Behavior: Behavior normal.      Imaging: XR C-ARM NO REPORT  Result Date: 11/13/2020 Please see Notes tab for imaging impression.

## 2020-11-13 NOTE — Procedures (Signed)
Lumbar Epidural Steroid Injection - Interlaminar Approach with Fluoroscopic Guidance  Patient: Austin Hernandez      Date of Birth: 07/01/1966 MRN: [id number] PCP: Ardith Dark, MD      Visit Date: 11/13/2020   Universal Protocol:     Consent Given By: the patient  Position: PRONE  Additional Comments: Vital signs were monitored before and after the procedure. Patient was prepped and draped in the usual sterile fashion. The correct patient, procedure, and site was verified.   Injection Procedure Details:   Procedure diagnoses: Lumbar radiculopathy [M54.16]   Meds Administered:  Meds ordered this encounter  Medications  . betamethasone acetate-betamethasone sodium phosphate (CELESTONE) injection 12 mg     Laterality: Right  Location/Site:  L4-L5  Needle: 3.5 in., 20 ga. Tuohy  Needle Placement: Paramedian epidural  Findings:   -Comments: Excellent flow of contrast into the epidural space.  Procedure Details: Using a paramedian approach from the side mentioned above, the region overlying the inferior lamina was localized under fluoroscopic visualization and the soft tissues overlying this structure were infiltrated with 4 ml. of 1% Lidocaine without Epinephrine. The Tuohy needle was inserted into the epidural space using a paramedian approach.   The epidural space was localized using loss of resistance along with counter oblique bi-planar fluoroscopic views.  After negative aspirate for air, blood, and CSF, a 2 ml. volume of Isovue-250 was injected into the epidural space and the flow of contrast was observed. Radiographs were obtained for documentation purposes.    The injectate was administered into the level noted above.   Additional Comments:  The patient tolerated the procedure well Dressing: 2 x 2 sterile gauze and Band-Aid    Post-procedure details: Patient was observed during the procedure. Post-procedure instructions were reviewed.  Patient left the  clinic in stable condition.

## 2020-11-13 NOTE — Patient Instructions (Signed)

## 2020-11-28 NOTE — Telephone Encounter (Signed)
Spoke with patient,stated cortisone shot help for the pain  Still with numbness on Rt foot, swelling and redness on Rt foot and ankle  Please advise

## 2020-12-04 DIAGNOSIS — I723 Aneurysm of iliac artery: Secondary | ICD-10-CM | POA: Diagnosis not present

## 2020-12-04 DIAGNOSIS — M79604 Pain in right leg: Secondary | ICD-10-CM | POA: Diagnosis not present

## 2020-12-05 ENCOUNTER — Telehealth: Payer: Self-pay

## 2020-12-05 NOTE — Telephone Encounter (Signed)
Patient is scheduled for Thursday. Wanted to wait to come into the office rather than going to urgent care.   Nurse Assessment Nurse: Henri Medal, RN, Amy Date/Hernandez Austin Hernandez): 12/05/2020 9:47:36 AM Confirm and document reason for call. If symptomatic, describe symptoms. ---Caller states he has had numbness in his right foot for 2 months. He's had sciatica symptoms since February. The doctor knows about it. He saw the orthopedic doctor, & he recommended a steroid shot. He got a shot in vertebra 3 weeks ago. It has helped the pain go away, but not the numbness or weakness. He has elevated BS too. It is getting worse. His cholesterol levels are worse also. He wants to get some blood work done again. Does the patient have any new or worsening symptoms? ---Yes Will a triage be completed? ---Yes Related visit to physician within the last 2 weeks? ---No Does the PT have any chronic conditions? (i.e. diabetes, asthma, this includes High risk factors for pregnancy, etc.) ---Yes List chronic conditions. ---prediabetic Is this a behavioral health or substance abuse call? ---No Guidelines Guideline Title Affirmed Question Affirmed Notes Nurse Date/Hernandez ([place] Hernandez) Neurologic Deficit [1] Numbness (i.e., loss of sensation) of Lovelace, RN, Amy 12/05/2020 9:49:45 AM PLEASE NOTE: All timestamps contained within this report are represented as [place] Standard Hernandez. CONFIDENTIALTY NOTICE: This fax transmission is intended only for the addressee. It contains information that is legally privileged, confidential or otherwise protected from use or disclosure. If you are not the intended recipient, you are strictly prohibited from reviewing, disclosing, copying using or disseminating any of this information or taking any action in reliance on or regarding this information. If you have received this fax in error, please notify us immediately by telephone so that we can arrange for its return to [place].  Phone: [phone], Toll-Free: [phone], Fax: [phone] Page: 2 of 2 Call Id: [id number] Guidelines Guideline Title Affirmed Question Affirmed Notes Nurse Date/Hernandez Austin Hernandez) the face, arm / hand, or leg / foot on one side of the body AND [2] gradual onset (e.g., days to weeks) AND [3] present now Disp. Hernandez Austin Hernandez) Disposition Final User 12/05/2020 9:46:50 AM Send to Urgent Juel Burrow 12/05/2020 9:51:35 AM See HCP within 4 Hours (or PCP triage) Yes Lovelace, RN, Amy Caller Disagree/Comply Comply Caller Understands Yes PreDisposition InappropriateToAsk Care Advice Given Per Guideline SEE HCP (OR PCP TRIAGE) WITHIN 4 HOURS: * IF OFFICE WILL BE OPEN: You need to be seen within the next 3 or 4 hours. Call your doctor (or NP/PA) now or as soon as the office opens. CALL BACK IF: * You become worse CARE ADVICE given per Neurologic Deficit (Adult) guideline. Comments User: Lutricia Horsfall, RN Date/Hernandez Austin Hernandez): 12/05/2020 9:54:42 AM Warm transferred caller to [facility] at the back line. Referrals Warm transfer to backlin

## 2020-12-05 NOTE — Telephone Encounter (Signed)
FYI. Appt scheduled.  

## 2020-12-07 ENCOUNTER — Other Ambulatory Visit: Payer: Self-pay

## 2020-12-07 ENCOUNTER — Encounter: Payer: Self-pay | Admitting: Family Medicine

## 2020-12-07 ENCOUNTER — Ambulatory Visit (INDEPENDENT_AMBULATORY_CARE_PROVIDER_SITE_OTHER): Payer: BC Managed Care – PPO | Admitting: Family Medicine

## 2020-12-07 VITALS — BP 138/86 | HR 94 | Temp 97.6°F | Ht 75.0 in | Wt 314.6 lb

## 2020-12-07 DIAGNOSIS — I723 Aneurysm of iliac artery: Secondary | ICD-10-CM | POA: Diagnosis not present

## 2020-12-07 DIAGNOSIS — E785 Hyperlipidemia, unspecified: Secondary | ICD-10-CM | POA: Diagnosis not present

## 2020-12-07 DIAGNOSIS — M5416 Radiculopathy, lumbar region: Secondary | ICD-10-CM

## 2020-12-07 DIAGNOSIS — R7303 Prediabetes: Secondary | ICD-10-CM | POA: Diagnosis not present

## 2020-12-07 LAB — CBC
HCT: 44.2 % (ref 39.0–52.0)
Hemoglobin: 15.6 g/dL (ref 13.0–17.0)
MCHC: 35.3 g/dL (ref 30.0–36.0)
MCV: 92.3 fl (ref 78.0–100.0)
Platelets: 195 10*3/uL (ref 150.0–400.0)
RBC: 4.79 Mil/uL (ref 4.22–5.81)
RDW: 13.5 % (ref 11.5–15.5)
WBC: 7.6 10*3/uL (ref 4.0–10.5)

## 2020-12-07 LAB — COMPREHENSIVE METABOLIC PANEL
ALT: 95 U/L — ABNORMAL HIGH (ref 0–53)
AST: 49 U/L — ABNORMAL HIGH (ref 0–37)
Albumin: 4.3 g/dL (ref 3.5–5.2)
Alkaline Phosphatase: 69 U/L (ref 39–117)
BUN: 13 mg/dL (ref 6–23)
CO2: 27 mEq/L (ref 19–32)
Calcium: 9.6 mg/dL (ref 8.4–10.5)
Chloride: 104 mEq/L (ref 96–112)
Creatinine, Ser: 0.87 mg/dL (ref 0.40–1.50)
GFR: 96.23 mL/min (ref >60.00)
Glucose, Bld: 81 mg/dL (ref 70–99)
Potassium: 3.9 mEq/L (ref 3.5–5.1)
Sodium: 141 mEq/L (ref 135–145)
Total Bilirubin: 0.5 mg/dL (ref 0.2–1.2)
Total Protein: 7.2 g/dL (ref 6.0–8.3)

## 2020-12-07 LAB — LIPID PANEL
Cholesterol: 250 mg/dL — ABNORMAL HIGH (ref 0–200)
HDL: 45 mg/dL (ref >39.00)
LDL Cholesterol: 166 mg/dL — ABNORMAL HIGH (ref 0–99)
NonHDL: 205.31
Total CHOL/HDL Ratio: 6
Triglycerides: 196 mg/dL — ABNORMAL HIGH (ref 0.0–149.0)
VLDL: 39.2 mg/dL (ref 0.0–40.0)

## 2020-12-07 LAB — TSH: TSH: 3.92 u[IU]/mL (ref 0.35–4.50)

## 2020-12-07 LAB — VITAMIN B12: Vitamin B-12: 360 pg/mL (ref 211–911)

## 2020-12-07 LAB — HEMOGLOBIN A1C: Hgb A1c MFr Bld: 7 % — ABNORMAL HIGH (ref 4.6–6.5)

## 2020-12-07 NOTE — Assessment & Plan Note (Signed)
Follows with vascular surgery.  Has follow-up with them again in 6 months.

## 2020-12-07 NOTE — Assessment & Plan Note (Signed)
Follows with orthopedics and PMNR.  Recently had epidural steroid injection which helped a lot with the pain though still has persistent weakness, numbness, and tingling.  We will check labs today to rule out other potential causes per patient request.  Check TSH, CBC, c-Met, A1c, and B12.

## 2020-12-07 NOTE — Assessment & Plan Note (Signed)
Check A1c.  Currently on metformin 500 mg daily.  He is trying to lose weight.  May switch him over to Ozempic depending on result of his A1c.

## 2020-12-07 NOTE — Assessment & Plan Note (Signed)
Check lipids.  Will likely need to start Lipitor depending on results.

## 2020-12-07 NOTE — Patient Instructions (Signed)
It was very nice to see you today!  We will check blood work today.  Depending on the results we may need to get you back on a cholesterol medication or potentially switch your blood sugar medication to Ozempic.  Your symptoms are probably coming from a pinched nerve in your back.  Please follow-up with your back doctor next previously planned.  Take care, Dr [name]  PLEASE NOTE:  If you had any lab tests please let us know if you have not heard back within a few days. You may see your results on mychart before we have a chance to review them but we will give you a call once they are reviewed by [place]. If we ordered any referrals today, please let us know if you have not heard from their office within the next week.   Please try these tips to maintain a healthy lifestyle:  Eat at least 3 REAL meals and 1-2 snacks per day.  Aim for no more than 5 hours between eating.  If you eat breakfast, please do so within one hour of getting up.   Each meal should contain half fruits/vegetables, one quarter protein, and one quarter carbs (no bigger than a computer mouse)  Cut down on sweet beverages. This includes juice, soda, and sweet tea.   Drink at least 1 glass of water with each meal and aim for at least 8 glasses per day  Exercise at least 150 minutes every week.

## 2020-12-07 NOTE — Progress Notes (Signed)
   Austin Hernandez is a 55 y.o. male who presents today for an office visit.  Assessment/Plan:  Chronic Problems Addressed Today: Lumbar radiculopathy Follows with orthopedics and PMNR.  Recently had epidural steroid injection which helped a lot with the pain though still has persistent weakness, numbness, and tingling.  We will check labs today to rule out other potential causes per patient request.  Check TSH, CBC, c-Met, A1c, and B12.  Iliac artery aneurysm, bilateral ([place]) Follows with vascular surgery.  Has follow-up with them again in 6 months.  Dyslipidemia Check lipids.  Will likely need to start Lipitor depending on results.  Prediabetes Check A1c.  Currently on metformin 500 mg daily.  He is trying to lose weight.  May switch him over to [place] depending on result of his A1c.     Subjective:  HPI:  Patient here for follow-up for numbness and tingling in his feet.  This has been going on for several months.  He has seen orthopedics in the past.  Received steroid injection.  This improved the pain but still has persistent numbness and weakness.  Predominantly located to right lower extremity.  Feels weak when walking.  He would like to have blood work done today to make sure there is nothing else that is contributing to his lumbar radiculopathy.      Objective:  Physical Exam: BP 138/86   Pulse 94   Temp 97.6 F (36.4 C) (Temporal)   Ht $R'6\' 3"'pQ$  (1.905 m)   Wt (!) 314 lb 9.6 oz (142.7 kg)   SpO2 95%   BMI 39.32 kg/m   Gen: No acute distress, resting comfortably CV: Regular rate and rhythm with no murmurs appreciated Pulm: Normal work of breathing, clear to auscultation bilaterally with no crackles, wheezes, or rhonchi MSK: Bilateral lower extremities without deformities.  Neurovascular intact distally.  Strength 5 out of 5 in both lower extremities.  Distal pulses intact. Neuro: Grossly normal, moves all extremities Psych: Normal affect and thought content       Emmalou Hunger M. Jerline Pain, MD 12/07/2020 2:49 PM

## 2020-12-08 NOTE — Progress Notes (Signed)
Please inform patient of the following:  His A1c is better than last time.  Do not think this is contributing to the symptoms he is having.  His liver numbers are better than last time as well.  His cholesterol still elevated.  Recommend starting Lipitor 40 mg daily and we can recheck in 6 to 12 months.  All his other blood work is normal.  We can start Ozempic 0.25 mg weekly to help with his blood sugar and weight loss if he is interested.  Please send in if patient is willing to start.  HE should increase to 0.5mg  weekly after 4 weeks if he decides to start. We should recheck his A1c in 3 months.

## 2020-12-11 ENCOUNTER — Other Ambulatory Visit: Payer: Self-pay

## 2020-12-11 MED ORDER — SEMAGLUTIDE(0.25 OR 0.5MG/DOS) 2 MG/1.5ML ~~LOC~~ SOPN: 0.5000 mg | PEN_INJECTOR | SUBCUTANEOUS | 2 refills | Status: DC

## 2020-12-11 MED ORDER — SEMAGLUTIDE 3 MG PO TABS: 3.0000 mg | ORAL_TABLET | Freq: Every day | ORAL | 0 refills | Status: DC

## 2020-12-11 MED ORDER — ROSUVASTATIN CALCIUM 40 MG PO TABS: 40.0000 mg | ORAL_TABLET | Freq: Every day | ORAL | 1 refills | Status: DC

## 2020-12-11 NOTE — Telephone Encounter (Signed)
I spoke to the pt to give message below. He says that he will give his insurance a call to see if this medication is covered. He will update [place] afterwards.

## 2020-12-27 ENCOUNTER — Telehealth: Payer: Self-pay

## 2020-12-27 NOTE — Telephone Encounter (Signed)
Patient would like to be worked in to see Dr. August Saucer for right leg/foot pain.  Stated that right foot and ankle are swollen and his middle toes are numb and hard.  CB# [phone].  Please advise.  Thank you.

## 2020-12-28 NOTE — Telephone Encounter (Signed)
IC. No answer. LMVM advising was returning call to discuss.

## 2021-01-02 ENCOUNTER — Ambulatory Visit (INDEPENDENT_AMBULATORY_CARE_PROVIDER_SITE_OTHER): Payer: BC Managed Care – PPO | Admitting: Family

## 2021-01-02 DIAGNOSIS — M5416 Radiculopathy, lumbar region: Secondary | ICD-10-CM

## 2021-01-05 ENCOUNTER — Telehealth: Payer: Self-pay | Admitting: Physical Medicine and Rehabilitation

## 2021-01-05 NOTE — Telephone Encounter (Signed)
Pt returned call to [facility] for appt. Please call pt at [phone].

## 2021-01-05 NOTE — Progress Notes (Signed)
Office Visit Note   Patient: Austin Hernandez           Date of Birth: [birth year]           MRN: [id number] Visit Date: 01/02/2021              Requested by: Ardith Dark, MD [address] [place],  [place] [address] PCP: Ardith Dark, MD  Chief Complaint  Patient presents with   Right Foot - Pain   Right Leg - Pain      HPI: The patient is a 55 year old gentleman seen in follow up for right lower extremity numbness. He is quite concerned as this has worsened over the last couple weeks. He feels like he "has frost bite" in the 2nd and 3rd toes. States the feel cold, hard and have occasionally turned blue after prolonged standing.   He was initially seen by Dr. August Saucer for same. Initially started with right knee pain. Went on to develop radicular pain from back to toes on the right side. Also with buttock pain. Feels having weakness in legs.  Was seen by Dr. Alvester Morin on 5/16 for right L4L5 lumbar interlaminar epidural steroid injection  Recent MRI (10/25/20) showed arthritic change at L4-5 with lateral recess narrowing actually left more than right but also with central canal narrowing do to epidural lipomatosis.  Secondarily has been having issues with major bilateral issue.  He will follow up with right extremity become painful patient is resolved in the morning.  On his recent MRI.  Bilateral common iliac artery aneurysm left greater than right.  He was referred to vascular surgery.  At this time they recommend monitoring.  They will follow-up with patient in 6 months..  Assessment & Plan: Visit Diagnoses:  1. Lumbar radiculopathy, right     Plan: Discussed course with patient at length.  Will refer back to Dr. Alvester Morin for repeat ESI.  Have also provided a referral to spine, patient would like to see Dr. Shon Baton.  Follow-Up Instructions: Return for esi c [place].   Back Exam   Tenderness  The patient is experiencing no tenderness.   Muscle Strength  Right Quadriceps:  5/5   Left Quadriceps:  5/5  Right Hamstrings:  5/5  Left Hamstrings:  5/5   Tests  Straight leg raise right: positive Straight leg raise left: negative     Patient is alert, oriented, no adenopathy, well-dressed, normal affect, normal respiratory effort. Limping gait. States is dragging the right leg.   Imaging: No results found. No images are attached to the encounter.  Labs: Lab Results  Component Value Date   HGBA1C 7.0 (H) 12/07/2020   HGBA1C 7.3 (H) 05/18/2020   HGBA1C 6.2 09/15/2018     Lab Results  Component Value Date   ALBUMIN 4.3 12/07/2020   ALBUMIN 4.3 09/15/2018   ALBUMIN 4.2 03/24/2017    No results found for: MG Lab Results  Component Value Date   VD25OH 33.58 08/19/2017   VD25OH 41.3 03/24/2017   VD25OH 17.6 (L) 11/11/2016    No results found for: PREALBUMIN CBC EXTENDED Latest Ref Rng & Units 12/07/2020 05/18/2020 09/15/2018  WBC 4.0 - 10.5 K/uL 7.6 8.2 5.4  RBC 4.22 - 5.81 Mil/uL 4.79 5.01 4.96  HGB 13.0 - 17.0 g/dL 62.1 30.8 65.7  HCT 84.6 - 52.0 % 44.2 46.6 46.4  PLT 150.0 - 400.0 K/uL 195.0 180 170.0  NEUTROABS 1.4 - 7.0 x10E3/uL - - -  LYMPHSABS 0.7 - 3.1  x10E3/uL - - -     There is no height or weight on file to calculate BMI.  Orders:  Orders Placed This Encounter  Procedures   Ambulatory referral to Physical Medicine Rehab   Ambulatory referral to Orthopedic Surgery   No orders of the defined types were placed in this encounter.    Procedures: No procedures performed  Clinical Data: No additional findings.  ROS:  All other systems negative, except as noted in the HPI. Review of Systems  Constitutional:  Negative for chills, fatigue and fever.  Cardiovascular:  Positive for leg swelling.  Musculoskeletal:  Positive for back pain and gait problem.  Neurological:  Positive for weakness and numbness.   Objective: Vital Signs: There were no vitals taken for this visit.  Specialty Comments:  No specialty comments  available.  PMFS History: Patient Active Problem List   Diagnosis Date Noted   Iliac artery aneurysm, bilateral ([facility]) 12/07/2020   Lumbar radiculopathy 12/07/2020   Recurrent low back pain 05/18/2020   OSA (obstructive sleep apnea) 12/11/2016   Prediabetes 12/09/2016   History of cold sores 03/28/2008   Dyslipidemia 03/28/2008   Allergic rhinitis 03/28/2008   Past Medical History:  Diagnosis Date   Back pain    GERD (gastroesophageal reflux disease)    History of chicken pox    HLD (hyperlipidemia)    Joint pain    OSA (obstructive sleep apnea) 12/11/2016   Restless leg    Seasonal allergies    Sleep apnea     Family History  Problem Relation Age of Onset   Arthritis Mother    Hypertension Mother    Hyperlipidemia Mother    Squamous cell carcinoma Father    Cancer Father 39       throat cancer (squamous), smoker   Stroke Maternal Grandmother 50   Diabetes Maternal Grandmother    Cancer Paternal Grandfather        lung?   Coronary artery disease Neg Hx     Past Surgical History:  Procedure Laterality Date   ADENOIDECTOMY  1970   ANKLE SURGERY  1980s   torn ligaments; bilateral; separate surgeries (4 total), sports   UMBILICAL HERNIA REPAIR     Social History   Occupational History   Occupation: GENERAL MANAGER [employer]  Tobacco Use   Smoking status: Former    Packs/day: 1.00    Years: 25.00    Pack years: 25.00    Types: Cigarettes    Quit date: 07/01/2010    Years since quitting: 10.5   Smokeless tobacco: Never  Vaping Use   Vaping Use: Never used  Substance and Sexual Activity   Alcohol use: Yes    Alcohol/week: 5.0 - 6.0 standard drinks    Types: 5 - 6 Glasses of wine per week    Comment: Occasional (weekends)   Drug use: No   Sexual activity: Not on file

## 2021-01-08 NOTE — Telephone Encounter (Signed)
Patient was calling back to schedule appointment with Dr. Alvester Morin.

## 2021-01-08 NOTE — Telephone Encounter (Signed)
Referral from [place]. OV with Megan or Dr. Alvester Morin.

## 2021-01-08 NOTE — Telephone Encounter (Signed)
Left message #2

## 2021-01-16 ENCOUNTER — Telehealth: Payer: Self-pay | Admitting: Physical Medicine and Rehabilitation

## 2021-01-16 ENCOUNTER — Ambulatory Visit (INDEPENDENT_AMBULATORY_CARE_PROVIDER_SITE_OTHER): Payer: BC Managed Care – PPO | Admitting: Physical Medicine and Rehabilitation

## 2021-01-16 ENCOUNTER — Other Ambulatory Visit: Payer: Self-pay

## 2021-01-16 ENCOUNTER — Encounter: Payer: Self-pay | Admitting: Physical Medicine and Rehabilitation

## 2021-01-16 ENCOUNTER — Other Ambulatory Visit: Payer: Self-pay | Admitting: Family Medicine

## 2021-01-16 VITALS — BP 142/97 | HR 101

## 2021-01-16 DIAGNOSIS — M5416 Radiculopathy, lumbar region: Secondary | ICD-10-CM | POA: Diagnosis not present

## 2021-01-16 DIAGNOSIS — M7989 Other specified soft tissue disorders: Secondary | ICD-10-CM

## 2021-01-16 DIAGNOSIS — G90521 Complex regional pain syndrome I of right lower limb: Secondary | ICD-10-CM | POA: Diagnosis not present

## 2021-01-16 DIAGNOSIS — R202 Paresthesia of skin: Secondary | ICD-10-CM

## 2021-01-16 DIAGNOSIS — R200 Anesthesia of skin: Secondary | ICD-10-CM

## 2021-01-16 MED ORDER — VALACYCLOVIR HCL 500 MG PO TABS: 500.0000 mg | ORAL_TABLET | Freq: Two times a day (BID) | ORAL | 0 refills | Status: DC

## 2021-01-16 MED ORDER — PREGABALIN 75 MG PO CAPS: ORAL_CAPSULE | ORAL | 1 refills | Status: DC

## 2021-01-16 NOTE — Telephone Encounter (Signed)
Dr. [name], pt requesting refill for Valtrex 500 mg one tablet BID. Last filled 10/25/2020. Okay to fill?

## 2021-01-16 NOTE — Progress Notes (Signed)
Pt state Lower back pain that travels down right leg. Pt state of swelling and numbness in his calf and foot. Pt state his toes feel hard at time. Pt state standing and walking makes the pain worse. Pt state he throw his foot forward because he can't feel it. Pt state he uses pain cream, he uses ice and over the counter pain meds. Pt state his last inj help with the pain and he could sleep but numbness was still there.Pt state he fall last week, [place] the airport his right foot trip his left foot.  Numeric Pain Rating Scale and Functional Assessment Average Pain 10 Pain Right Now 10 My pain is constant, sharp, stabbing, and tingling Pain is worse with: walking, standing, and some activites Pain improves with: heat/ice and medication   In the last MONTH (on 0-10 scale) has pain interfered with the following?  1. General activity like being  able to carry out your everyday physical activities such as walking, climbing stairs, carrying groceries, or moving a chair?  Rating(9)  2. Relation with others like being able to carry out your usual social activities and roles such as  activities at home, at work and in your community. Rating(9)  3. Enjoyment of life such that you have  been bothered by emotional problems such as feeling anxious, depressed or irritable?  Rating(9)

## 2021-01-16 NOTE — Telephone Encounter (Signed)
Is auth needed for right L4 TF? Scheduled for 7/21.

## 2021-01-16 NOTE — Progress Notes (Signed)
Sewell Pitner - 55 y.o. male MRN [id number]  Date of birth: 02/24/1966  Office Visit Note: Visit Date: 01/16/2021 PCP: Ardith Dark, MD Referred by: Ardith Dark, MD  Subjective: Chief Complaint  Patient presents with   Lower Back - Pain   Right Leg - Pain, Weakness   Right Foot - Pain, Weakness   HPI: Austin Hernandez is a 55 y.o. male who comes in today at the request of [name], FNP for evaluation of chronic, worsening and severe right lower back pain radiating to buttock, leg and foot. Patient reports his right lower back pain is significantly better since right L4-L5 interlaminar epidural steroid injection in May, however he continues to have severe right lower leg swelling, pain and numbness.  Patient's recent lumbar MRI exhibits prominence of epidural fat at L4-L5 with mild-to-moderate narrowing of the thecal sac. Patient reports right lower leg symptoms have progressively worsened over the last 2 months.  Patient reports pain worsens with walking and prolonged standing, currently rates pain as 8 out of 10.  Patient reports some pain relief with Voltaren gel, rest and elevation of right lower extremity.  Patient reports recent fall last week while walking in the airport due to numbness and tingling of right lower extremity, no injuries noted.  Patient states his right lower extremity feels like it is asleep and is making it difficult for him to ambulate.  Patient states that he feels like his right lower back pain is well controlled, however he is finding it difficult to work and complete daily tasks due to right lower extremity issues.  Patient denies focal weakness.  Review of Systems  Musculoskeletal:  Positive for back pain.  Skin:        Pt reports redness, swelling and pain to right lower extremity.   Neurological:        Pt reports numbness and tingling to right lower extremity.  All other systems reviewed and are negative. Otherwise per HPI.  Assessment &  Plan: Visit Diagnoses:    ICD-10-CM   1. Lumbar radiculopathy  M54.16 Ambulatory referral to Neurology    2. Complex regional pain syndrome type 1 of right lower extremity  G90.521 Ambulatory referral to Neurology    3. Right leg swelling  M79.89     4. Numbness and tingling of right leg  R20.0    R20.2        Plan: Findings:  Chronic, worsening and severe right sided lumbar radiculopathy. Patient reports good sustained relief of right sided back pain since last injection, however is having deteriorating right lower extremity pain, swelling and numbness. Patient's exam and imaging are consistent with classic S1 distribution with worsening numbness/tingling to right posterior leg and lateral foot. Chronic regional pain syndrome (CRPS) is also being considered based on presenting symptoms and severe allodynia. Patient was recently seen by Dr. [name] with vascular surgery at [facility] and diagnosed with bilateral iliac artery aneurysms, which they will continue to monitor.  We believe the next step would be to perform a diagnostic and hopefully therapeutic right L4-L5 transforaminal epidural steroid injection. Patient prescribed Lyrica today and will hopefully help with his right lower extremity neuropathic pain. Lumbar MRI discussed with wife and patient today, he is agreeable with plan of care and we will re-evaluate after injection is completed. Patient has upcoming appointment with Dr. [name] at [facility] to discuss possible surgical interventions. [facility] referral also placed today to address right lower extremity numbness/tingling  and for possible nerve study. If patient does not get significant pain relief with injection, we will consider sympathetic blocks.  No red flag symptoms noted upon exam.    Meds & Orders:  Meds ordered this encounter  Medications   pregabalin (LYRICA) 75 MG capsule    Sig: Take 1 tablet by mouth at night for 5-7 nights, then  take 1 tablet in the morning and 1 tablet at night.    Dispense:  60 capsule    Refill:  1    Order Specific Question:   Supervising Provider    Answer:   Tyrell Antonio [[id number]]    Orders Placed This Encounter  Procedures   Ambulatory referral to Neurology    Follow-up: Return in about 2 days (around 01/18/2021) for Right L4-L5 transforaminal epidural steroid injection.   Procedures: No procedures performed      Clinical History: Pain right leg.  Lumbar radiculopathy.   EXAM: MRI LUMBAR SPINE WITHOUT CONTRAST   TECHNIQUE: Multiplanar, multisequence MR imaging of the lumbar spine was performed. No intravenous contrast was administered.   COMPARISON:  Radiographs October 02, 2020.   FINDINGS: Segmentation:  Standard.   Alignment:  Maintained   Vertebrae:  No fracture, evidence of discitis, or bone lesion.   Conus medullaris and cauda equina: Conus extends to the L1 level. Conus and cauda equina appear normal.   Paraspinal and other soft tissues: Common iliac arteries aneurysms measuring 2.5 cm on the right and 2.8 cm on the left.   Disc levels:   T12-L1: No spinal canal or neural foraminal stenosis.   L1-2: Loss of disc height, shallow disc bulge. No spinal canal or neural foraminal stenosis.   L2-3: Loss of disc height, shallow disc bulge and mild facet degenerative changes without significant spinal canal or neural foraminal stenosis.   L3-4: Shallow disc bulge and mild facet degenerative changes without significant spinal canal or neural foraminal stenosis.   L4-5: Loss of disc height, disc bulge, mild facet degenerative changes and prominence of the epidural fat resulting in mild-to-moderate narrowing of the thecal sac and mild-to-moderate bilateral neural foraminal narrowing.   L5-S1: Loss of disc height, disc bulge with associated osteophytic component and mild facet degenerative changes resulting mild right and moderate left neural foraminal. No  spinal canal stenosis.   IMPRESSION: 1. Multilevel degenerative changes of the lumbar spine as described, most pronounced at L4-5 where there is mild-to-moderate narrowing of the thecal sac and mild-to-moderate bilateral neural foraminal narrowing. 2. Mild right and moderate left neural foraminal narrowing at L5-S1. 3. Common iliac arteries aneurysms measuring 2.5 cm on the right and 2.8 cm on the left.     Electronically Signed   By: Baldemar Lenis M.D.   On: 10/25/2020 16:49   He reports that he quit smoking about 10 years ago. His smoking use included cigarettes. He has a 25.00 pack-year smoking history. He has never used smokeless tobacco.  Recent Labs    05/18/20 1550 12/07/20 1451  HGBA1C 7.3* 7.0*    Objective:  VS:  HT:    WT:   BMI:     BP:(!) 142/97  HR:(!) 101bpm  TEMP: ( )  RESP:  Physical Exam HENT:     Head: Normocephalic and atraumatic.     Right Ear: Tympanic membrane normal.     Left Ear: Tympanic membrane normal.     Nose: Nose normal.     Mouth/Throat:     Mouth: Mucous membranes are moist.  Eyes:     Pupils: Pupils are equal, round, and reactive to light.  Cardiovascular:     Rate and Rhythm: Normal rate.     Pulses: Normal pulses.  Pulmonary:     Effort: Pulmonary effort is normal.  Abdominal:     General: There is no distension.  Musculoskeletal:     Cervical back: Normal range of motion and neck supple.     Comments: Pt rises from seated position to standing without difficulty. Good lumbar range of motion. Strong distal strength without clonus, no pain upon palpation of greater trochanters. Walks independently, gait steady.   Impaired sensation, pain and tenderness noted upon palpation of right lower extremity. 3+ pedal pulses.   Skin:    Capillary Refill: Capillary refill takes less than 2 seconds.     Comments: Erythema and 1+ mild pitting edema noted to right lower extremity.   Neurological:     General: No focal  deficit present.     Mental Status: He is alert.  Psychiatric:        Mood and Affect: Mood normal.    Ortho Exam  Imaging: No results found.  Past Medical/Family/Surgical/Social History: Medications & Allergies reviewed per EMR, new medications updated. Patient Active Problem List   Diagnosis Date Noted   Iliac artery aneurysm, bilateral (HCC) 12/07/2020   Lumbar radiculopathy 12/07/2020   Recurrent low back pain 05/18/2020   OSA (obstructive sleep apnea) 12/11/2016   Prediabetes 12/09/2016   History of cold sores 03/28/2008   Dyslipidemia 03/28/2008   Allergic rhinitis 03/28/2008   Past Medical History:  Diagnosis Date   Back pain    GERD (gastroesophageal reflux disease)    History of chicken pox    HLD (hyperlipidemia)    Joint pain    OSA (obstructive sleep apnea) 12/11/2016   Restless leg    Seasonal allergies    Sleep apnea    Family History  Problem Relation Age of Onset   Arthritis Mother    Hypertension Mother    Hyperlipidemia Mother    Squamous cell carcinoma Father    Cancer Father 24       throat cancer (squamous), smoker   Stroke Maternal Grandmother 50   Diabetes Maternal Grandmother    Cancer Paternal Grandfather        lung?   Coronary artery disease Neg Hx    Past Surgical History:  Procedure Laterality Date   ADENOIDECTOMY  1970   ANKLE SURGERY  1980s   torn ligaments; bilateral; separate surgeries (4 total), sports   UMBILICAL HERNIA REPAIR     Social History   Occupational History   Occupation: GENERAL MANAGER [employer]  Tobacco Use   Smoking status: Former    Packs/day: 1.00    Years: 25.00    Pack years: 25.00    Types: Cigarettes    Quit date: 07/01/2010    Years since quitting: 10.5   Smokeless tobacco: Never  Vaping Use   Vaping Use: Never used  Substance and Sexual Activity   Alcohol use: Yes    Alcohol/week: 5.0 - 6.0 standard drinks    Types: 5 - 6 Glasses of wine per week    Comment: Occasional (weekends)   Drug use:  No   Sexual activity: Not on file

## 2021-01-18 ENCOUNTER — Encounter: Payer: Self-pay | Admitting: Physical Medicine and Rehabilitation

## 2021-01-18 ENCOUNTER — Ambulatory Visit (INDEPENDENT_AMBULATORY_CARE_PROVIDER_SITE_OTHER): Payer: BC Managed Care – PPO | Admitting: Physical Medicine and Rehabilitation

## 2021-01-18 ENCOUNTER — Ambulatory Visit: Payer: Self-pay

## 2021-01-18 ENCOUNTER — Other Ambulatory Visit: Payer: Self-pay

## 2021-01-18 VITALS — BP 170/114 | HR 96

## 2021-01-18 DIAGNOSIS — M5416 Radiculopathy, lumbar region: Secondary | ICD-10-CM | POA: Diagnosis not present

## 2021-01-18 MED ORDER — METHYLPREDNISOLONE ACETATE 80 MG/ML IJ SUSP
80.0000 mg | Freq: Once | INTRAMUSCULAR | Status: AC
  Administered 2021-01-18: 80 mg

## 2021-01-18 NOTE — Patient Instructions (Signed)

## 2021-01-18 NOTE — Progress Notes (Signed)
Pt state lower back numbness that travels down to his right leg and foot. Pt state walking, standing and sitting makes the pain worse. Pt state he takes pain meds to help ease pain and numbness. Pt has hx of inj on 11/13/20 pt state it helped.  Numeric Pain Rating Scale and Functional Assessment Average Pain 10   In the last MONTH (on 0-10 scale) has pain interfered with the following?  1. General activity like being  able to carry out your everyday physical activities such as walking, climbing stairs, carrying groceries, or moving a chair?  Rating(10)   +Driver, -BT, -Dye Allergies.

## 2021-01-23 NOTE — Progress Notes (Signed)
Austin Hernandez - 55 y.o. male MRN [id number]  Date of birth: 01/09/1966  Office Visit Note: Visit Date: 01/18/2021 PCP: Ardith Dark, MD Referred by: Ardith Dark, MD  Subjective: Chief Complaint  Patient presents with   Lower Back - Pain, Numbness   Right Foot - Numbness   HPI:  Austin Hernandez is a 55 y.o. male who comes in today for planned Right L4-L5 Lumbar Transforaminal epidural steroid injection with fluoroscopic guidance.  The patient has failed conservative care including home exercise, medications, time and activity modification.  This injection will be diagnostic and hopefully therapeutic.  Please see requesting physician notes for further details and justification.  By way of brief review patient has had radicular symptoms on the right of more of an S1 distribution but with stenosis at L4-5 mainly Duda epidural lipomatosis.  He is also had some findings consistent and possibly with early complex regional pain syndrome.  In that regard he has pending electrodiagnostic study/neurologic evaluation as well as upcoming surgery evaluation by Dr. Shon Baton at [facility].  We had also started him on Lyrica and he has only taken 2 nighttime doses of 75 mg but his wife is very concerned with increased blood pressure noted today.  Blood pressure was noted as 170/100.  My staff reviewed blood pressure readings from prior notes with vascular surgery and primary physicians and he has had hypertension for some time but it was clearly higher today.  I did suggest that a lot of times appointments for injections of blood pressure will be up.  Its not at a point where I am worried about it for the injection itself.  His wife though is very concerned about the medication and she feels like the medication may have increased the blood pressure.  I discussed with him with great length that I have not seen that issue with Lyrica and that mainly I have seen blurry vision and ankle swelling and that is been on  very rare occasions.  He also is now on a full dose of the medication is only taking 2 pills 1 each night.  At this point what I would like him to do is see how the injection goes and monitor the blood pressure over the next couple of days if the pressure stays high they can discontinue the Lyrica and then after discontinuation of the Lyrica if the blood pressure goes back down then will know and he will not really be able to take that medication.  They seem to agree on the course of action.  They can always call with concerns.   ROS Otherwise per HPI.  Assessment & Plan: Visit Diagnoses:    ICD-10-CM   1. Lumbar radiculopathy  M54.16 XR C-ARM NO REPORT    Epidural Steroid injection    methylPREDNISolone acetate (DEPO-MEDROL) injection 80 mg      Plan: No additional findings.   Meds & Orders:  Meds ordered this encounter  Medications   methylPREDNISolone acetate (DEPO-MEDROL) injection 80 mg    Orders Placed This Encounter  Procedures   XR C-ARM NO REPORT   Epidural Steroid injection    Follow-up: No follow-ups on file.   Procedures: No procedures performed  Lumbosacral Transforaminal Epidural Steroid Injection - Sub-Pedicular Approach with Fluoroscopic Guidance  Patient: Austin Hernandez      Date of Birth: 06-May-1966 MRN: [id number] PCP: Ardith Dark, MD      Visit Date: 01/18/2021   Universal Protocol:    Date/Time: 01/18/2021  Consent Given By: the patient  Position: PRONE  Additional Comments: Vital signs were monitored before and after the procedure. Patient was prepped and draped in the usual sterile fashion. The correct patient, procedure, and site was verified.   Injection Procedure Details:   Procedure diagnoses: Lumbar radiculopathy [M54.16]    Meds Administered:  Meds ordered this encounter  Medications   methylPREDNISolone acetate (DEPO-MEDROL) injection 80 mg    Laterality: Right  Location/Site:  L4-L5  Needle:5.0 in., 22 ga.  Short bevel  or Quincke spinal needle  Needle Placement: Transforaminal  Findings:    -Comments: Excellent flow of contrast along the nerve, nerve root and into the epidural space.  Procedure Details: After squaring off the end-plates to get a true AP view, the C-arm was positioned so that an oblique view of the foramen as noted above was visualized. The target area is just inferior to the "nose of the scotty dog" or sub pedicular. The soft tissues overlying this structure were infiltrated with 2-3 ml. of 1% Lidocaine without Epinephrine.  The spinal needle was inserted toward the target using a "trajectory" view along the fluoroscope beam.  Under AP and lateral visualization, the needle was advanced so it did not puncture dura and was located close the 6 O'Clock position of the pedical in AP tracterory. Biplanar projections were used to confirm position. Aspiration was confirmed to be negative for CSF and/or blood. A 1-2 ml. volume of Isovue-250 was injected and flow of contrast was noted at each level. Radiographs were obtained for documentation purposes.   After attaining the desired flow of contrast documented above, a 0.5 to 1.0 ml test dose of 0.25% Marcaine was injected into each respective transforaminal space.  The patient was observed for 90 seconds post injection.  After no sensory deficits were reported, and normal lower extremity motor function was noted,   the above injectate was administered so that equal amounts of the injectate were placed at each foramen (level) into the transforaminal epidural space.   Additional Comments:  The patient tolerated the procedure well Dressing: 2 x 2 sterile gauze and Band-Aid    Post-procedure details: Patient was observed during the procedure. Post-procedure instructions were reviewed.  Patient left the clinic in stable condition.    Clinical History: Pain right leg.  Lumbar radiculopathy.   EXAM: MRI LUMBAR SPINE WITHOUT CONTRAST    TECHNIQUE: Multiplanar, multisequence MR imaging of the lumbar spine was performed. No intravenous contrast was administered.   COMPARISON:  Radiographs October 02, 2020.   FINDINGS: Segmentation:  Standard.   Alignment:  Maintained   Vertebrae:  No fracture, evidence of discitis, or bone lesion.   Conus medullaris and cauda equina: Conus extends to the L1 level. Conus and cauda equina appear normal.   Paraspinal and other soft tissues: Common iliac arteries aneurysms measuring 2.5 cm on the right and 2.8 cm on the left.   Disc levels:   T12-L1: No spinal canal or neural foraminal stenosis.   L1-2: Loss of disc height, shallow disc bulge. No spinal canal or neural foraminal stenosis.   L2-3: Loss of disc height, shallow disc bulge and mild facet degenerative changes without significant spinal canal or neural foraminal stenosis.   L3-4: Shallow disc bulge and mild facet degenerative changes without significant spinal canal or neural foraminal stenosis.   L4-5: Loss of disc height, disc bulge, mild facet degenerative changes and prominence of the epidural fat resulting in mild-to-moderate narrowing of the thecal sac and mild-to-moderate bilateral  neural foraminal narrowing.   L5-S1: Loss of disc height, disc bulge with associated osteophytic component and mild facet degenerative changes resulting mild right and moderate left neural foraminal. No spinal canal stenosis.   IMPRESSION: 1. Multilevel degenerative changes of the lumbar spine as described, most pronounced at L4-5 where there is mild-to-moderate narrowing of the thecal sac and mild-to-moderate bilateral neural foraminal narrowing. 2. Mild right and moderate left neural foraminal narrowing at L5-S1. 3. Common iliac arteries aneurysms measuring 2.5 cm on the right and 2.8 cm on the left.     Electronically Signed   By: Baldemar Lenis M.D.   On: 10/25/2020 16:49     Objective:  VS:  HT:     WT:   BMI:     BP:(!) 170/114  HR:96bpm  TEMP: ( )  RESP:  Physical Exam Vitals and nursing note reviewed.  Constitutional:      General: He is not in acute distress.    Appearance: Normal appearance. He is obese. He is not ill-appearing.  HENT:     Head: Normocephalic and atraumatic.     Right Ear: External ear normal.     Left Ear: External ear normal.     Nose: No congestion.  Eyes:     Extraocular Movements: Extraocular movements intact.  Cardiovascular:     Rate and Rhythm: Normal rate.     Pulses: Normal pulses.  Pulmonary:     Effort: Pulmonary effort is normal. No respiratory distress.  Abdominal:     General: There is no distension.     Palpations: Abdomen is soft.  Musculoskeletal:        General: No tenderness or signs of injury.     Cervical back: Neck supple.     Right lower leg: No edema.     Left lower leg: No edema.     Comments: Patient has good distal strength without clonus.  Dysesthesia in right S1 distribution predominantly.  He does have some swelling and mild temperature change on the right versus left.  Skin:    Findings: No erythema or rash.  Neurological:     General: No focal deficit present.     Mental Status: He is alert and oriented to person, place, and time.     Sensory: No sensory deficit.     Motor: No weakness or abnormal muscle tone.     Coordination: Coordination normal.  Psychiatric:        Mood and Affect: Mood normal.        Behavior: Behavior normal.     Imaging: No results found.

## 2021-01-23 NOTE — Procedures (Signed)
Lumbosacral Transforaminal Epidural Steroid Injection - Sub-Pedicular Approach with Fluoroscopic Guidance  Patient: Austin Hernandez      Date of Birth: [birth year] MRN: [id number] PCP: Ardith Dark, MD      Visit Date: 01/18/2021   Universal Protocol:    Date/Time: 01/18/2021  Consent Given By: the patient  Position: PRONE  Additional Comments: Vital signs were monitored before and after the procedure. Patient was prepped and draped in the usual sterile fashion. The correct patient, procedure, and site was verified.   Injection Procedure Details:   Procedure diagnoses: Lumbar radiculopathy [M54.16]    Meds Administered:  Meds ordered this encounter  Medications   methylPREDNISolone acetate (DEPO-MEDROL) injection 80 mg    Laterality: Right  Location/Site:  L4-L5  Needle:5.0 in., 22 ga.  Short bevel or Quincke spinal needle  Needle Placement: Transforaminal  Findings:    -Comments: Excellent flow of contrast along the nerve, nerve root and into the epidural space.  Procedure Details: After squaring off the end-plates to get a true AP view, the C-arm was positioned so that an oblique view of the foramen as noted above was visualized. The target area is just inferior to the "nose of the scotty dog" or sub pedicular. The soft tissues overlying this structure were infiltrated with 2-3 ml. of 1% Lidocaine without Epinephrine.  The spinal needle was inserted toward the target using a "trajectory" view along the fluoroscope beam.  Under AP and lateral visualization, the needle was advanced so it did not puncture dura and was located close the 6 O'Clock position of the pedical in AP tracterory. Biplanar projections were used to confirm position. Aspiration was confirmed to be negative for CSF and/or blood. A 1-2 ml. volume of Isovue-250 was injected and flow of contrast was noted at each level. Radiographs were obtained for documentation purposes.   After attaining the desired  flow of contrast documented above, a 0.5 to 1.0 ml test dose of 0.25% Marcaine was injected into each respective transforaminal space.  The patient was observed for 90 seconds post injection.  After no sensory deficits were reported, and normal lower extremity motor function was noted,   the above injectate was administered so that equal amounts of the injectate were placed at each foramen (level) into the transforaminal epidural space.   Additional Comments:  The patient tolerated the procedure well Dressing: 2 x 2 sterile gauze and Band-Aid    Post-procedure details: Patient was observed during the procedure. Post-procedure instructions were reviewed.  Patient left the clinic in stable condition.

## 2021-02-05 DIAGNOSIS — M545 Low back pain, unspecified: Secondary | ICD-10-CM | POA: Diagnosis not present

## 2021-02-12 ENCOUNTER — Other Ambulatory Visit: Payer: Self-pay | Admitting: Family Medicine

## 2021-02-15 ENCOUNTER — Ambulatory Visit (INDEPENDENT_AMBULATORY_CARE_PROVIDER_SITE_OTHER): Payer: BC Managed Care – PPO | Admitting: Diagnostic Neuroimaging

## 2021-02-15 ENCOUNTER — Encounter (INDEPENDENT_AMBULATORY_CARE_PROVIDER_SITE_OTHER): Payer: BC Managed Care – PPO | Admitting: Diagnostic Neuroimaging

## 2021-02-15 DIAGNOSIS — R29898 Other symptoms and signs involving the musculoskeletal system: Secondary | ICD-10-CM | POA: Diagnosis not present

## 2021-02-15 DIAGNOSIS — Z0289 Encounter for other administrative examinations: Secondary | ICD-10-CM

## 2021-02-15 DIAGNOSIS — R200 Anesthesia of skin: Secondary | ICD-10-CM

## 2021-02-15 DIAGNOSIS — G8929 Other chronic pain: Secondary | ICD-10-CM

## 2021-02-15 NOTE — Procedures (Signed)
[facility]  NCS (NERVE CONDUCTION STUDY) WITH EMG (ELECTROMYOGRAPHY) REPORT   STUDY DATE: 02/15/2021 PATIENT NAME: Austin Hernandez DOB: [birth year] MRN: [id number]  ORDERING CLINICIAN: Naaman Plummer, MD / Juanda Chance, NP   TECHNOLOGIST: Durenda Age ELECTROMYOGRAPHER: Glenford Bayley. Sholom Dulude, MD  CLINICAL INFORMATION: 55 year old male with slip and fall on ice in February 2022.  1 week later developed severe pain in the right lower back rating to the right leg and foot.  Was treated with epidural steroid injection with improvement of pain but continues to have numbness in the right leg with weakness.  On exam patient has weakness of right foot dorsiflexion, right foot inversion and eversion, right foot plantar flexion.   FINDINGS: NERVE CONDUCTION STUDY:  Bilateral tibial and left peroneal motor responses are normal.  Right peroneal motor response has normal distal latency, decreased amplitude and normal conduction velocity.  Bilateral sural and left superficial peroneal sensory responses are normal.  Right superficial peroneal sensory response has normal peak latency and decreased amplitude.  Left tibial F-wave latency is normal.  Right tibial F-wave latency is slightly prolonged.    NEEDLE ELECTROMYOGRAPHY:  Needle examination of bilateral lower extremities and bilateral lumbar paraspinal muscles demonstrates: abnormal spontaneous activity and decreased recruitment of large motor units in right gluteus medius, right biceps femoris short head, right peroneus longus, right tibialis anterior, right tibialis posterior, right gastrocnemius muscles.    Increased insertional activity noted in right lower lumbar paraspinal muscles.    Right vastus medialis and left tibialis anterior muscles are normal.    IMPRESSION:   Abnormal study demonstrating: - Electrodiagnostic evidence of active and chronic denervation affecting multiple right L5 and right S1 innervated  muscles, likely related to right L5 and S1 radiculopathies.   INTERPRETING PHYSICIAN:  Suanne Marker, MD Certified in Neurology, Neurophysiology and Neuroimaging  [facility] [address] [place], [place] [address] [phone]  [facility]    Nerve / Sites Muscle Latency Ref. Amplitude Ref. Rel Amp Segments Distance Velocity Ref. Area    ms ms mV mV %  cm m/s m/s mVms  L Peroneal - EDB     Ankle EDB 5.2 ?6.5 5.3 ?2.0 100 Ankle - EDB 9   15.1     Fib head EDB 12.0  4.0  75.5 Fib head - Ankle 30 44 ?44 15.1     Pop fossa EDB 14.2  4.5  113 Pop fossa - Fib head 10 44 ?44 14.3         Pop fossa - Ankle      R Peroneal - EDB     Ankle EDB 4.8 ?6.5 0.8 ?2.0 100 Ankle - EDB 9   3.5     Fib head EDB 11.6  0.5  64.8 Fib head - Ankle 30 44 ?44 2.5     Pop fossa EDB 13.8  0.5  105 Pop fossa - Fib head 10 44 ?44 1.9         Pop fossa - Ankle      L Tibial - AH     Ankle AH 4.5 ?5.8 10.6 ?4.0 100 Ankle - AH 9   27.1     Pop fossa AH 14.2  8.0  75.3 Pop fossa - Ankle 40 41 ?41 23.6  R Tibial - AH     Ankle AH 3.9 ?5.8 7.1 ?4.0 100 Ankle - AH 9   19.9     Pop fossa AH 13.6  5.6  79.2 Pop fossa - Ankle 40 41 ?41 18.5             SNC    Nerve / Sites Rec. Site Peak Lat Ref.  Amp Ref. Segments Distance    ms ms V V  cm  L Sural - Ankle (Calf)     Calf Ankle 4.0 ?4.4 7 ?6 Calf - Ankle 14  R Sural - Ankle (Calf)     Calf Ankle 3.8 ?4.4 7 ?6 Calf - Ankle 14  L Superficial peroneal - Ankle     Lat leg Ankle 3.8 ?4.4 7 ?6 Lat leg - Ankle 14  R Superficial peroneal - Ankle     Lat leg Ankle 3.5 ?4.4 3 ?6 Lat leg - Ankle 14             F  Wave    Nerve F Lat Ref.   ms ms  L Tibial - AH 54.2 ?56.0  R Tibial - AH 59.9 ?56.0         EMG Summary Table    Spontaneous MUAP Recruitment  Muscle IA Fib PSW Fasc Other Amp Dur. Poly Pattern  R. Vastus medialis Normal None None None _______ Normal Normal Normal Normal  R. Tibialis anterior Normal 2+ 2+ None _______  Increased Normal Normal Reduced  R. Gastrocnemius (Medial head) Normal 2+ 2+ None _______ Increased Normal Normal Reduced  R. Tibialis posterior Normal 3+ 3+ None _______ Increased Normal Normal Reduced  R. Peroneus longus Normal None 1+ None _______ Increased Normal Normal Reduced  R. Biceps femoris (short head) Normal 2+ 2+ None _______ Increased Normal Normal Reduced  R. Gluteus medius Normal 2+ 2+ None _______ Increased Normal Normal Reduced  L. Tibialis anterior Normal None None None _______ Normal Normal Normal Normal  R. Lumbar paraspinals (low) Increased None None None _______ Normal Normal Normal Normal  R. Lumbar paraspinals (mid) Normal None None None _______ Normal Normal Normal Normal  L. Lumbar paraspinals (low) Normal None None None _______ Normal Normal Normal Normal

## 2021-02-16 NOTE — Progress Notes (Signed)
Called patient again on mobile and work numbers. Calls went straight to voicemail. Left message #2.

## 2021-02-19 NOTE — Progress Notes (Signed)
Left message #3

## 2021-02-22 NOTE — Progress Notes (Signed)
Left message #4.  

## 2021-02-24 ENCOUNTER — Other Ambulatory Visit: Payer: Self-pay | Admitting: Family Medicine

## 2021-02-28 ENCOUNTER — Telehealth: Payer: Self-pay | Admitting: Physical Medicine and Rehabilitation

## 2021-02-28 NOTE — Telephone Encounter (Signed)
CC'd chart message from Ellin Goodie, NP: We need to call Mr. Koch due to abnormal NCV/EMG, he was recently sent to Dr. Shon Baton for surgical evaluation. Looks like he will need surgery. Patient needs to get second opinion on surgical options due to continuing weakness to right lower extremity. Can we make referral to first available neurosurgeon? I have called the patient and left messages x4. Patient has not returned my calls.

## 2021-03-14 ENCOUNTER — Other Ambulatory Visit (INDEPENDENT_AMBULATORY_CARE_PROVIDER_SITE_OTHER): Payer: BC Managed Care – PPO

## 2021-03-14 ENCOUNTER — Other Ambulatory Visit: Payer: Self-pay

## 2021-03-14 DIAGNOSIS — R7989 Other specified abnormal findings of blood chemistry: Secondary | ICD-10-CM

## 2021-03-15 LAB — COMPREHENSIVE METABOLIC PANEL
ALT: 92 U/L — ABNORMAL HIGH (ref 0–53)
AST: 53 U/L — ABNORMAL HIGH (ref 0–37)
Albumin: 4.2 g/dL (ref 3.5–5.2)
Alkaline Phosphatase: 68 U/L (ref 39–117)
BUN: 12 mg/dL (ref 6–23)
CO2: 25 mEq/L (ref 19–32)
Calcium: 9.7 mg/dL (ref 8.4–10.5)
Chloride: 104 mEq/L (ref 96–112)
Creatinine, Ser: 0.87 mg/dL (ref 0.40–1.50)
GFR: 96.04 mL/min (ref >60.00)
Glucose, Bld: 92 mg/dL (ref 70–99)
Potassium: 3.8 mEq/L (ref 3.5–5.1)
Sodium: 139 mEq/L (ref 135–145)
Total Bilirubin: 0.5 mg/dL (ref 0.2–1.2)
Total Protein: 7 g/dL (ref 6.0–8.3)

## 2021-03-19 NOTE — Progress Notes (Signed)
Please inform patient of the following:  Liver numbers still elevated. Recommend RUQ ultrasound to look at his liver.  Katina Degree. [name], MD 03/19/2021 8:17 AM

## 2021-03-20 ENCOUNTER — Other Ambulatory Visit: Payer: Self-pay

## 2021-03-20 DIAGNOSIS — R7989 Other specified abnormal findings of blood chemistry: Secondary | ICD-10-CM

## 2021-03-30 ENCOUNTER — Ambulatory Visit
Admission: RE | Admit: 2021-03-30 | Discharge: 2021-03-30 | Disposition: A | Discharge status: Home / Self Care | Payer: BC Managed Care – PPO | Source: Ambulatory Visit | Attending: Family Medicine | Admitting: Family Medicine

## 2021-03-30 DIAGNOSIS — R7989 Other specified abnormal findings of blood chemistry: Secondary | ICD-10-CM | POA: Diagnosis not present

## 2021-04-02 ENCOUNTER — Encounter: Payer: Self-pay | Admitting: Family Medicine

## 2021-04-02 ENCOUNTER — Other Ambulatory Visit: Payer: Self-pay

## 2021-04-02 ENCOUNTER — Ambulatory Visit (INDEPENDENT_AMBULATORY_CARE_PROVIDER_SITE_OTHER): Payer: BC Managed Care – PPO | Admitting: Family Medicine

## 2021-04-02 VITALS — BP 143/91 | HR 91 | Temp 98.5°F | Ht 75.0 in | Wt 320.8 lb

## 2021-04-02 DIAGNOSIS — M5416 Radiculopathy, lumbar region: Secondary | ICD-10-CM | POA: Diagnosis not present

## 2021-04-02 DIAGNOSIS — E785 Hyperlipidemia, unspecified: Secondary | ICD-10-CM | POA: Diagnosis not present

## 2021-04-02 DIAGNOSIS — Z23 Encounter for immunization: Secondary | ICD-10-CM

## 2021-04-02 DIAGNOSIS — R7303 Prediabetes: Secondary | ICD-10-CM

## 2021-04-02 NOTE — Patient Instructions (Signed)
It was very nice to see you today!  We will place a referral for you to see a neurosurgeon.  Please continue working on diet and exercise.  We will give you your flu shot today.   Take care, Dr [name]  PLEASE NOTE:  If you had any lab tests please let us know if you have not heard back within a few days. You may see your results on mychart before we have a chance to review them but we will give you a call once they are reviewed by [place]. If we ordered any referrals today, please let us know if you have not heard from their office within the next week.   Please try these tips to maintain a healthy lifestyle:  Eat at least 3 REAL meals and 1-2 snacks per day.  Aim for no more than 5 hours between eating.  If you eat breakfast, please do so within one hour of getting up.   Each meal should contain half fruits/vegetables, one quarter protein, and one quarter carbs (no bigger than a computer mouse)  Cut down on sweet beverages. This includes juice, soda, and sweet tea.   Drink at least 1 glass of water with each meal and aim for at least 8 glasses per day  Exercise at least 150 minutes every week.

## 2021-04-02 NOTE — Progress Notes (Signed)
   Austin Hernandez is a 55 y.o. male who presents today for an office visit.  Assessment/Plan:  Chronic Problems Addressed Today: Morbid obesity (HCC) Patient's mobility limited due to the severe radiculopathy.  We discussed referral back to healthy weight management clinic.  He deferred for today.  Lumbar radiculopathy Recent nerve conduction study showed L5 and S1 right-sided radiculopathy.  Still has persistent weakness and loss of sensation to the area.  Will place referral to neurosurgery for further management.  Dyslipidemia He has not yet started Crestor.  He would like to work on diet and exercise first.  Flu vaccine given today.     Subjective:  HPI:  Patient here for follow-up.  Please see A/P for status of chronic conditions.  His primary concern at this point is continued neuropathy in his right lower extremity.  He saw orthopedics for this about 8 months ago.  Had MRI done that showed multilevel degenerative change with mild right and moderate left foraminal narrowing.  He had additional work-up at that point for other possible causes of neuropathy including labs.  Eventually had vascular work-up done as well.  All these were within expected ranges.  About 6 weeks ago followed up with neurology and had nerve conduction study performed.This was consistent with right-sided L5 and S1 radiculopathies.  Has significant numbness and loss of sensation right lower extremity.  Also has weakness and loss of motor control.  Pain has been minimal.       Objective:  Physical Exam: BP (!) 143/91   Pulse 91   Temp 98.5 F (36.9 C) (Temporal)   Ht 6\' 3"  (1.905 m)   Wt (!) 320 lb 12.8 oz (145.5 kg)   SpO2 95%   BMI 40.10 kg/m   Gen: No acute distress, resting comfortably CV: Regular rate and rhythm with no murmurs appreciated Pulm: Normal work of breathing, clear to auscultation bilaterally with no crackles, wheezes, or rhonchi Neuro: Grossly normal, moves all extremities Psych:  Normal affect and thought content      Austin Hernandez M. , MD 04/02/2021 2:28 PM

## 2021-04-02 NOTE — Assessment & Plan Note (Signed)
He has not yet started Crestor.  He would like to work on diet and exercise first.

## 2021-04-02 NOTE — Progress Notes (Signed)
Please inform patient of the following:  His ultrasound shows that he has fatty liver disease. Do not need to make any changes at this point but he should continue working on diet and exercise and we can recheck his liver numbers with his next blood draw.  Katina Degree. [name], MD 04/02/2021 7:52 AM

## 2021-04-02 NOTE — Assessment & Plan Note (Signed)
Recent nerve conduction study showed L5 and S1 right-sided radiculopathy.  Still has persistent weakness and loss of sensation to the area.  Will place referral to neurosurgery for further management.

## 2021-04-02 NOTE — Assessment & Plan Note (Signed)
Patient's mobility limited due to the severe radiculopathy.  We discussed referral back to healthy weight management clinic.  He deferred for today.

## 2021-05-04 ENCOUNTER — Other Ambulatory Visit: Payer: Self-pay | Admitting: Family Medicine

## 2021-05-04 DIAGNOSIS — R7303 Prediabetes: Secondary | ICD-10-CM

## 2021-05-30 DIAGNOSIS — M5416 Radiculopathy, lumbar region: Secondary | ICD-10-CM | POA: Diagnosis not present

## 2021-05-31 ENCOUNTER — Other Ambulatory Visit (HOSPITAL_COMMUNITY): Payer: Self-pay | Admitting: Neurological Surgery

## 2021-05-31 ENCOUNTER — Other Ambulatory Visit: Payer: Self-pay | Admitting: Neurological Surgery

## 2021-05-31 DIAGNOSIS — M5416 Radiculopathy, lumbar region: Secondary | ICD-10-CM

## 2021-06-11 DIAGNOSIS — M5416 Radiculopathy, lumbar region: Secondary | ICD-10-CM | POA: Diagnosis not present

## 2021-06-15 ENCOUNTER — Ambulatory Visit (HOSPITAL_COMMUNITY)
Admission: RE | Admit: 2021-06-15 | Discharge: 2021-06-15 | Disposition: A | Discharge status: Home / Self Care | Payer: BC Managed Care – PPO | Source: Ambulatory Visit | Attending: Neurological Surgery | Admitting: Neurological Surgery

## 2021-06-15 ENCOUNTER — Other Ambulatory Visit: Payer: Self-pay

## 2021-06-15 DIAGNOSIS — M21371 Foot drop, right foot: Secondary | ICD-10-CM | POA: Diagnosis not present

## 2021-06-15 DIAGNOSIS — M5136 Other intervertebral disc degeneration, lumbar region: Secondary | ICD-10-CM | POA: Diagnosis not present

## 2021-06-15 DIAGNOSIS — M48061 Spinal stenosis, lumbar region without neurogenic claudication: Secondary | ICD-10-CM | POA: Diagnosis not present

## 2021-06-15 DIAGNOSIS — M5416 Radiculopathy, lumbar region: Secondary | ICD-10-CM | POA: Insufficient documentation

## 2021-06-15 LAB — GLUCOSE, CAPILLARY
Glucose-Capillary: 191 mg/dL — ABNORMAL HIGH (ref 70–99)
Glucose-Capillary: 181 mg/dL — ABNORMAL HIGH (ref 70–99)

## 2021-06-15 MED ORDER — DIAZEPAM 5 MG PO TABS: 10.0000 mg | ORAL_TABLET | Freq: Once | ORAL | Status: AC

## 2021-06-15 MED ORDER — HYDROCODONE-ACETAMINOPHEN 5-325 MG PO TABS
1.0000 | ORAL_TABLET | ORAL | Status: DC | PRN
Start: 2021-06-15 — End: 2021-06-16

## 2021-06-15 MED ORDER — ONDANSETRON HCL 4 MG/2ML IJ SOLN: 4.0000 mg | Freq: Four times a day (QID) | INTRAMUSCULAR | Status: DC | PRN

## 2021-06-15 MED ORDER — IOHEXOL 180 MG/ML  SOLN
20.0000 mL | Freq: Once | INTRAMUSCULAR | Status: AC | PRN
  Administered 2021-06-15: 14 mL via INTRATHECAL

## 2021-06-15 MED ORDER — LIDOCAINE HCL (PF) 1 % IJ SOLN
5.0000 mL | Freq: Once | INTRAMUSCULAR | Status: AC
  Administered 2021-06-15: 5 mL via INTRADERMAL

## 2021-06-15 MED ORDER — DIAZEPAM 5 MG PO TABS
ORAL_TABLET | ORAL | Status: AC
  Administered 2021-06-15: 10 mg via ORAL
  Filled 2021-06-15: qty 2

## 2021-06-15 NOTE — Procedures (Signed)
Austin Hernandez is a 55 year old individual who is developed a foot drop in the right leg since the beginning of the year has had work-up for this process including EMG and nerve conduction study and appears to have isolated L5 radiculopathy MRI however demonstrates that there is only mild spondylitic stenosis in the lateral recess myelogram is now being performed to better look at this process and see if there is overt compression of the L5 nerve root.  Pre op Dx: Lumbar radiculopathy with right foot drop Post op Dx: Same Procedure: Lumbar myelogram postmyelogram CAT scan Surgeon: Ardath Lepak Puncture level: Isovue 180, 12 mL Fluid color: Clear colorless Injection: L2-L3 Findings: Mild lateral recess stenosis at L4-L5 with mild retrolisthesis of L4 on L5 without worsening with flexion extension further evaluation with CT scanning.

## 2021-06-18 DIAGNOSIS — E785 Hyperlipidemia, unspecified: Secondary | ICD-10-CM | POA: Diagnosis not present

## 2021-06-18 DIAGNOSIS — R7303 Prediabetes: Secondary | ICD-10-CM | POA: Diagnosis not present

## 2021-06-18 DIAGNOSIS — G4733 Obstructive sleep apnea (adult) (pediatric): Secondary | ICD-10-CM | POA: Diagnosis not present

## 2021-06-18 DIAGNOSIS — I723 Aneurysm of iliac artery: Secondary | ICD-10-CM | POA: Diagnosis not present

## 2021-06-19 ENCOUNTER — Telehealth: Payer: Self-pay | Admitting: Diagnostic Neuroimaging

## 2021-06-19 DIAGNOSIS — M5416 Radiculopathy, lumbar region: Secondary | ICD-10-CM | POA: Diagnosis not present

## 2021-06-19 NOTE — Telephone Encounter (Signed)
Patient came into office to pick up past medical records. August NCV/EMG notes

## 2021-06-21 DIAGNOSIS — M5416 Radiculopathy, lumbar region: Secondary | ICD-10-CM | POA: Diagnosis not present

## 2021-07-25 DIAGNOSIS — M5416 Radiculopathy, lumbar region: Secondary | ICD-10-CM | POA: Diagnosis not present

## 2021-07-30 ENCOUNTER — Other Ambulatory Visit: Payer: Self-pay | Admitting: Family Medicine

## 2021-07-30 DIAGNOSIS — R7303 Prediabetes: Secondary | ICD-10-CM

## 2021-07-30 DIAGNOSIS — M5416 Radiculopathy, lumbar region: Secondary | ICD-10-CM | POA: Diagnosis not present

## 2021-08-01 DIAGNOSIS — R7989 Other specified abnormal findings of blood chemistry: Secondary | ICD-10-CM | POA: Diagnosis not present

## 2021-08-01 DIAGNOSIS — E119 Type 2 diabetes mellitus without complications: Secondary | ICD-10-CM | POA: Diagnosis not present

## 2021-08-01 DIAGNOSIS — E291 Testicular hypofunction: Secondary | ICD-10-CM | POA: Diagnosis not present

## 2021-08-01 DIAGNOSIS — Z125 Encounter for screening for malignant neoplasm of prostate: Secondary | ICD-10-CM | POA: Diagnosis not present

## 2021-08-01 DIAGNOSIS — R5383 Other fatigue: Secondary | ICD-10-CM | POA: Diagnosis not present

## 2021-08-01 DIAGNOSIS — R635 Abnormal weight gain: Secondary | ICD-10-CM | POA: Diagnosis not present

## 2021-08-01 DIAGNOSIS — E559 Vitamin D deficiency, unspecified: Secondary | ICD-10-CM | POA: Diagnosis not present

## 2021-08-01 DIAGNOSIS — E785 Hyperlipidemia, unspecified: Secondary | ICD-10-CM | POA: Diagnosis not present

## 2021-08-13 DIAGNOSIS — R635 Abnormal weight gain: Secondary | ICD-10-CM | POA: Diagnosis not present

## 2021-08-13 DIAGNOSIS — E291 Testicular hypofunction: Secondary | ICD-10-CM | POA: Diagnosis not present

## 2021-08-13 DIAGNOSIS — Z1339 Encounter for screening examination for other mental health and behavioral disorders: Secondary | ICD-10-CM | POA: Diagnosis not present

## 2021-08-13 DIAGNOSIS — Z1331 Encounter for screening for depression: Secondary | ICD-10-CM | POA: Diagnosis not present

## 2021-08-13 DIAGNOSIS — Z6841 Body Mass Index (BMI) 40.0 and over, adult: Secondary | ICD-10-CM | POA: Diagnosis not present

## 2021-08-22 ENCOUNTER — Other Ambulatory Visit: Payer: Self-pay | Admitting: Family Medicine

## 2021-08-22 ENCOUNTER — Encounter: Payer: Self-pay | Admitting: Family Medicine

## 2021-08-22 DIAGNOSIS — R7303 Prediabetes: Secondary | ICD-10-CM

## 2021-08-23 ENCOUNTER — Other Ambulatory Visit: Payer: Self-pay | Admitting: *Deleted

## 2021-08-23 DIAGNOSIS — E119 Type 2 diabetes mellitus without complications: Secondary | ICD-10-CM | POA: Diagnosis not present

## 2021-08-23 DIAGNOSIS — R7303 Prediabetes: Secondary | ICD-10-CM

## 2021-08-23 DIAGNOSIS — E291 Testicular hypofunction: Secondary | ICD-10-CM | POA: Diagnosis not present

## 2021-08-23 DIAGNOSIS — Z6841 Body Mass Index (BMI) 40.0 and over, adult: Secondary | ICD-10-CM | POA: Diagnosis not present

## 2021-08-23 MED ORDER — METFORMIN HCL 500 MG PO TABS: ORAL_TABLET | ORAL | 0 refills | Status: DC

## 2021-08-23 MED ORDER — ROSUVASTATIN CALCIUM 40 MG PO TABS: 40.0000 mg | ORAL_TABLET | Freq: Every day | ORAL | 1 refills | Status: DC

## 2021-08-23 MED ORDER — VALACYCLOVIR HCL 500 MG PO TABS: 500.0000 mg | ORAL_TABLET | Freq: Two times a day (BID) | ORAL | 1 refills | Status: DC

## 2021-08-23 NOTE — Telephone Encounter (Signed)
Can we clarify the dosage of gabapentin he is on? Looks like we increased it to 600mg  twice daily last year. Not sure how it fell off his medication list.   Austin Hernandez. Jerline Pain, MD 08/23/2021 8:54 AM

## 2021-08-23 NOTE — Telephone Encounter (Signed)
Patient requesting Rx gabapentin No on patient current list

## 2021-08-23 NOTE — Telephone Encounter (Signed)
Spoke with patient, will call back with information

## 2021-09-03 DIAGNOSIS — E291 Testicular hypofunction: Secondary | ICD-10-CM | POA: Diagnosis not present

## 2021-09-03 DIAGNOSIS — Z6841 Body Mass Index (BMI) 40.0 and over, adult: Secondary | ICD-10-CM | POA: Diagnosis not present

## 2021-09-03 DIAGNOSIS — K760 Fatty (change of) liver, not elsewhere classified: Secondary | ICD-10-CM | POA: Diagnosis not present

## 2021-09-10 DIAGNOSIS — E291 Testicular hypofunction: Secondary | ICD-10-CM | POA: Diagnosis not present

## 2021-09-21 DIAGNOSIS — E785 Hyperlipidemia, unspecified: Secondary | ICD-10-CM | POA: Diagnosis not present

## 2021-09-21 DIAGNOSIS — Z6841 Body Mass Index (BMI) 40.0 and over, adult: Secondary | ICD-10-CM | POA: Diagnosis not present

## 2021-09-21 DIAGNOSIS — E291 Testicular hypofunction: Secondary | ICD-10-CM | POA: Diagnosis not present

## 2021-10-19 DIAGNOSIS — Z6841 Body Mass Index (BMI) 40.0 and over, adult: Secondary | ICD-10-CM | POA: Diagnosis not present

## 2021-10-19 DIAGNOSIS — M5416 Radiculopathy, lumbar region: Secondary | ICD-10-CM | POA: Diagnosis not present

## 2021-10-23 ENCOUNTER — Encounter: Payer: Self-pay | Admitting: Family Medicine

## 2021-10-23 ENCOUNTER — Other Ambulatory Visit: Payer: Self-pay | Admitting: Neurological Surgery

## 2021-10-23 DIAGNOSIS — M5416 Radiculopathy, lumbar region: Secondary | ICD-10-CM

## 2021-10-25 ENCOUNTER — Other Ambulatory Visit: Payer: Self-pay | Admitting: *Deleted

## 2021-10-25 NOTE — Telephone Encounter (Signed)
Please send in refills for patient. ? ?Austin Hernandez. [name], MD ?10/25/2021 8:27 AM  ? ?

## 2021-10-29 ENCOUNTER — Other Ambulatory Visit: Payer: Self-pay

## 2021-10-29 MED ORDER — VALACYCLOVIR HCL 500 MG PO TABS: 500.0000 mg | ORAL_TABLET | Freq: Two times a day (BID) | ORAL | 1 refills | Status: DC

## 2021-10-29 MED ORDER — GABAPENTIN 300 MG PO CAPS: 300.0000 mg | ORAL_CAPSULE | Freq: Every evening | ORAL | 3 refills | Status: DC

## 2021-10-29 NOTE — Telephone Encounter (Signed)
Sent in Rx to pharmacy pt requested. Confirmed pt was taking Gabapentin again and dosage was still as previously taken and pt confirmed.  ?

## 2021-11-05 ENCOUNTER — Ambulatory Visit
Admission: RE | Admit: 2021-11-05 | Discharge: 2021-11-05 | Disposition: A | Discharge status: Home / Self Care | Payer: BC Managed Care – PPO | Source: Ambulatory Visit | Attending: Neurological Surgery | Admitting: Neurological Surgery

## 2021-11-05 DIAGNOSIS — M4316 Spondylolisthesis, lumbar region: Secondary | ICD-10-CM | POA: Diagnosis not present

## 2021-11-05 DIAGNOSIS — M5416 Radiculopathy, lumbar region: Secondary | ICD-10-CM

## 2021-11-05 DIAGNOSIS — M545 Low back pain, unspecified: Secondary | ICD-10-CM | POA: Diagnosis not present

## 2021-11-07 DIAGNOSIS — M5416 Radiculopathy, lumbar region: Secondary | ICD-10-CM | POA: Diagnosis not present

## 2021-11-19 ENCOUNTER — Ambulatory Visit (INDEPENDENT_AMBULATORY_CARE_PROVIDER_SITE_OTHER): Payer: BC Managed Care – PPO | Admitting: Family Medicine

## 2021-11-19 ENCOUNTER — Encounter: Payer: Self-pay | Admitting: Family Medicine

## 2021-11-19 VITALS — BP 151/98 | HR 88 | Temp 98.0°F | Ht 73.0 in | Wt 325.8 lb

## 2021-11-19 DIAGNOSIS — M5416 Radiculopathy, lumbar region: Secondary | ICD-10-CM

## 2021-11-19 DIAGNOSIS — Z0001 Encounter for general adult medical examination with abnormal findings: Secondary | ICD-10-CM | POA: Diagnosis not present

## 2021-11-19 DIAGNOSIS — E1169 Type 2 diabetes mellitus with other specified complication: Secondary | ICD-10-CM

## 2021-11-19 DIAGNOSIS — E785 Hyperlipidemia, unspecified: Secondary | ICD-10-CM

## 2021-11-19 DIAGNOSIS — Z1211 Encounter for screening for malignant neoplasm of colon: Secondary | ICD-10-CM

## 2021-11-19 DIAGNOSIS — E1165 Type 2 diabetes mellitus with hyperglycemia: Secondary | ICD-10-CM

## 2021-11-19 DIAGNOSIS — Z23 Encounter for immunization: Secondary | ICD-10-CM

## 2021-11-19 LAB — COMPREHENSIVE METABOLIC PANEL
ALT: 55 U/L — ABNORMAL HIGH (ref 0–53)
AST: 36 U/L (ref 0–37)
Albumin: 4.3 g/dL (ref 3.5–5.2)
Alkaline Phosphatase: 66 U/L (ref 39–117)
BUN: 13 mg/dL (ref 6–23)
CO2: 28 mEq/L (ref 19–32)
Calcium: 9.3 mg/dL (ref 8.4–10.5)
Chloride: 102 mEq/L (ref 96–112)
Creatinine, Ser: 0.96 mg/dL (ref 0.40–1.50)
GFR: 87.56 mL/min (ref >60.00)
Glucose, Bld: 225 mg/dL — ABNORMAL HIGH (ref 70–99)
Potassium: 4 mEq/L (ref 3.5–5.1)
Sodium: 139 mEq/L (ref 135–145)
Total Bilirubin: 0.5 mg/dL (ref 0.2–1.2)
Total Protein: 7 g/dL (ref 6.0–8.3)

## 2021-11-19 LAB — LIPID PANEL
Cholesterol: 125 mg/dL (ref 0–200)
HDL: 43 mg/dL (ref >39.00)
LDL Cholesterol: 57 mg/dL (ref 0–99)
NonHDL: 81.72
Total CHOL/HDL Ratio: 3
Triglycerides: 125 mg/dL (ref 0.0–149.0)
VLDL: 25 mg/dL (ref 0.0–40.0)

## 2021-11-19 LAB — CBC
HCT: 45.6 % (ref 39.0–52.0)
Hemoglobin: 15.8 g/dL (ref 13.0–17.0)
MCHC: 34.7 g/dL (ref 30.0–36.0)
MCV: 91.4 fl (ref 78.0–100.0)
Platelets: 154 10*3/uL (ref 150.0–400.0)
RBC: 5 Mil/uL (ref 4.22–5.81)
RDW: 13.4 % (ref 11.5–15.5)
WBC: 6.6 10*3/uL (ref 4.0–10.5)

## 2021-11-19 LAB — TSH: TSH: 3.35 u[IU]/mL (ref 0.35–5.50)

## 2021-11-19 LAB — HEMOGLOBIN A1C: Hgb A1c MFr Bld: 9 % — ABNORMAL HIGH (ref 4.6–6.5)

## 2021-11-19 MED ORDER — SEMAGLUTIDE(0.25 OR 0.5MG/DOS) 2 MG/3ML ~~LOC~~ SOPN: 0.2500 mg | PEN_INJECTOR | SUBCUTANEOUS | 0 refills | Status: DC

## 2021-11-19 NOTE — Assessment & Plan Note (Signed)
BMI 43.  He is working on diet and exercise however this is limited due to his orthopedic conditions.  We will follow-up again at his next office visit.

## 2021-11-19 NOTE — Assessment & Plan Note (Signed)
On Crestor 40 mg daily.  We will check lipids.

## 2021-11-19 NOTE — Progress Notes (Signed)
Chief Complaint:  Austin Hernandez is a 56 y.o. male who presents today for his annual comprehensive physical exam.    Assessment/Plan:  Chronic Problems Addressed Today: Morbid obesity (HCC) BMI 43.  He is working on diet and exercise however this is limited due to his orthopedic conditions.  We will follow-up again at his next office visit.  Lumbar radiculopathy Continue management per neurosurgery.  He is on gabapentin 300 mg nightly.  Dyslipidemia associated with type 2 diabetes mellitus (HCC) On Crestor 40 mg daily.  We will check lipids.  T2DM (type 2 diabetes mellitus) (HCC) Check A1c.  He is on metformin 500 mg daily though is likely not meeting his glycemic goals with this alone.  He is still having some lower extremity neuropathy which is most likely due to his lumbar radiculopathy however his glucose could be contributing as well.  We discussed alternative treatment options.  We will start Ozempic 0.25 mg weekly for 4 weeks and then increase to 0.5 mg weekly.  He will follow-up with me in 3 to 6 months depending on result of today's A1c.  We discussed potential side effects of Ozempic.  Preventative Healthcare: Shingles vaccine given today.  We will order Cologuard.  Check labs.  Patient Counseling(The following topics were reviewed and/or handout was given):  -Nutrition: Stressed importance of moderation in sodium/caffeine intake, saturated fat and cholesterol, caloric balance, sufficient intake of fresh fruits, vegetables, and fiber.  -Stressed the importance of regular exercise.   -Substance Abuse: Discussed cessation/primary prevention of tobacco, alcohol, or other drug use; driving or other dangerous activities under the influence; availability of treatment for abuse.   -Injury prevention: Discussed safety belts, safety helmets, smoke detector, smoking near bedding or upholstery.   -Sexuality: Discussed sexually transmitted diseases, partner selection, use of condoms,  avoidance of unintended pregnancy and contraceptive alternatives.   -Dental health: Discussed importance of regular tooth brushing, flossing, and dental visits.  -Health maintenance and immunizations reviewed. Please refer to Health maintenance section.  Return to care in 1 year for next preventative visit.     Subjective:  HPI:  He has no acute complaints today.   Lifestyle Diet: Balanced. Trying to cut down on sweets.  Exercise: Likes walking but limited due to lumbar radiculopathy.      11/19/2021   11:10 AM  Depression screen PHQ 2/9  Decreased Interest 0  Down, Depressed, Hopeless 0  PHQ - 2 Score 0    Health Maintenance Due  Topic Date Due   FOOT EXAM  Never done   OPHTHALMOLOGY EXAM  Never done   URINE MICROALBUMIN  Never done   Zoster Vaccines- Shingrix (1 of 2) Never done   HEMOGLOBIN A1C  06/08/2021   Fecal DNA (Cologuard)  09/21/2021    ROS: Per HPI, otherwise a complete review of systems was negative.   PMH:  The following were reviewed and entered/updated in epic: Past Medical History:  Diagnosis Date   Back pain    GERD (gastroesophageal reflux disease)    History of chicken pox    HLD (hyperlipidemia)    Joint pain    OSA (obstructive sleep apnea) 12/11/2016   Restless leg    Seasonal allergies    Sleep apnea    Patient Active Problem List   Diagnosis Date Noted   Morbid obesity (HCC) 04/02/2021   Iliac artery aneurysm, bilateral (HCC) 12/07/2020   Lumbar radiculopathy 12/07/2020   Recurrent low back pain 05/18/2020   OSA (obstructive sleep apnea) 12/11/2016  T2DM (type 2 diabetes mellitus) (HCC) 12/09/2016   History of cold sores 03/28/2008   Dyslipidemia associated with type 2 diabetes mellitus (HCC) 03/28/2008   Allergic rhinitis 03/28/2008   Past Surgical History:  Procedure Laterality Date   ADENOIDECTOMY  1970   ANKLE SURGERY  1980s   torn ligaments; bilateral; separate surgeries (4 total), sports   UMBILICAL HERNIA REPAIR       Family History  Problem Relation Age of Onset   Arthritis Mother    Hypertension Mother    Hyperlipidemia Mother    Squamous cell carcinoma Father    Cancer Father [id number]       throat cancer (squamous), smoker   Stroke Maternal Grandmother 50   Diabetes Maternal Grandmother    Cancer Paternal Grandfather        lung?   Coronary artery disease Neg Hx     Medications- reviewed and updated Current Outpatient Medications  Medication Sig Dispense Refill   gabapentin (NEURONTIN) 300 MG capsule Take 1 capsule (300 mg total) by mouth at bedtime. 90 capsule 3   rosuvastatin (CRESTOR) 40 MG tablet Take 1 tablet (40 mg total) by mouth daily. 90 tablet 1   Semaglutide,0.25 or 0.5MG /DOS, 2 MG/3ML SOPN Inject 0.25-0.5 mg into the skin once a week. 3 mL 0   valACYclovir (VALTREX) 500 MG tablet Take 1 tablet (500 mg total) by mouth 2 (two) times daily. 180 tablet 1   No current facility-administered medications for this visit.    Allergies-reviewed and updated No Known Allergies  Social History   Socioeconomic History   Marital status: Married    Spouse name: Not on file   Number of children: Not on file   Years of education: Not on file   Highest education level: Not on file  Occupational History   Occupation: GENERAL MANAGER [employer]  Tobacco Use   Smoking status: Former    Packs/day: 1.00    Years: 25.00    Pack years: 25.00    Types: Cigarettes    Quit date: 07/01/2010    Years since quitting: 11.3   Smokeless tobacco: Never  Vaping Use   Vaping Use: Never used  Substance and Sexual Activity   Alcohol use: Yes    Alcohol/week: 5.0 - 6.0 standard drinks    Types: 5 - 6 Glasses of wine per week    Comment: Occasional (weekends)   Drug use: No   Sexual activity: Not on file  Other Topics Concern   Not on file  Social History Narrative   Caffeine: 2-3 cups coffee/day   Lives alone   [place]   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  Some fruit/vegetables.   Social Determinants of Health   Financial Resource Strain: Not on file  Food Insecurity: Not on file  Transportation Needs: Not on file  Physical Activity: Not on file  Stress: Not on file  Social Connections: Not on file        Objective:  Physical Exam: BP (!) 151/98   Pulse 88   Temp 98 F (36.7 C) (Temporal)   Ht 6\' 1"  (1.854 m)   Wt (!) 325 lb 12.8 oz (147.8 kg)   SpO2 94%   BMI 42.98 kg/m   Body mass index is 42.98 kg/m. Wt Readings from Last 3 Encounters:  11/19/21 (!) 325 lb 12.8 oz (147.8 kg)  06/15/21 (!) 320 lb (145.2 kg)  04/02/21 (!) 320 lb 12.8 oz (145.5 kg)   Gen: NAD, resting comfortably HEENT: TMs normal bilaterally. OP clear. No thyromegaly noted.  CV: RRR with no murmurs appreciated Pulm: NWOB, CTAB with no crackles, wheezes, or rhonchi GI: Normal bowel sounds present. Soft, Nontender, Nondistended. MSK: no edema, cyanosis, or clubbing noted Skin: warm, dry Neuro: CN2-12 grossly intact. Strength 5/5 in upper and lower extremities. Reflexes symmetric and intact bilaterally.  Psych: Normal affect and thought content     Glendon Fiser M. [name], MD 11/19/2021 11:45 AM

## 2021-11-19 NOTE — Assessment & Plan Note (Signed)
Continue management per neurosurgery.  He is on gabapentin 300 mg nightly.

## 2021-11-19 NOTE — Assessment & Plan Note (Signed)
Check A1c.  He is on metformin 500 mg daily though is likely not meeting his glycemic goals with this alone.  He is still having some lower extremity neuropathy which is most likely due to his lumbar radiculopathy however his glucose could be contributing as well.  We discussed alternative treatment options.  We will start Ozempic 0.25 mg weekly for 4 weeks and then increase to 0.5 mg weekly.  He will follow-up with me in 3 to 6 months depending on result of today's A1c.  We discussed potential side effects of Ozempic.

## 2021-11-19 NOTE — Patient Instructions (Signed)
It was very nice to see you today!  We will start Ozempic.  Take 0.25 mg weekly for 4 weeks and then increase to 0.5 mg weekly.  Please send me a message in a few weeks to let me know how you are doing.  We will check blood work today.  We will give your shingles vaccine today.  We will see you back in 3 to 6 months depending on results of your blood work.  Take care, Dr [name]  PLEASE NOTE:  If you had any lab tests please let us know if you have not heard back within a few days. You may see your results on mychart before we have a chance to review them but we will give you a call once they are reviewed by [place]. If we ordered any referrals today, please let us know if you have not heard from their office within the next week.   Please try these tips to maintain a healthy lifestyle:  Eat at least 3 REAL meals and 1-2 snacks per day.  Aim for no more than 5 hours between eating.  If you eat breakfast, please do so within one hour of getting up.   Each meal should contain half fruits/vegetables, one quarter protein, and one quarter carbs (no bigger than a computer mouse)  Cut down on sweet beverages. This includes juice, soda, and sweet tea.   Drink at least 1 glass of water with each meal and aim for at least 8 glasses per day  Exercise at least 150 minutes every week.    Preventive Care 56-64 Years Old, Male Preventive care refers to lifestyle choices and visits with your health care provider that can promote health and wellness. Preventive care visits are also called wellness exams. What can I expect for my preventive care visit? Counseling During your preventive care visit, your health care provider may ask about your: Medical history, including: Past medical problems. Family medical history. Current health, including: Emotional well-being. Home life and relationship well-being. Sexual activity. Lifestyle, including: Alcohol, nicotine or tobacco, and drug use. Access to  firearms. Diet, exercise, and sleep habits. Safety issues such as seatbelt and bike helmet use. Sunscreen use. Work and work Astronomer. Physical exam Your health care provider will check your: Height and weight. These may be used to calculate your BMI (body mass index). BMI is a measurement that tells if you are at a healthy weight. Waist circumference. This measures the distance around your waistline. This measurement also tells if you are at a healthy weight and may help predict your risk of certain diseases, such as type 2 diabetes and high blood pressure. Heart rate and blood pressure. Body temperature. Skin for abnormal spots. What immunizations do I need?  Vaccines are usually given at various ages, according to a schedule. Your health care provider will recommend vaccines for you based on your age, medical history, and lifestyle or other factors, such as travel or where you work. What tests do I need? Screening Your health care provider may recommend screening tests for certain conditions. This may include: Lipid and cholesterol levels. Diabetes screening. This is done by checking your blood sugar (glucose) after you have not eaten for a while (fasting). Hepatitis B test. Hepatitis C test. HIV (human immunodeficiency virus) test. STI (sexually transmitted infection) testing, if you are at risk. Lung cancer screening. Prostate cancer screening. Colorectal cancer screening. Talk with your health care provider about your test results, treatment options, and if necessary, the  need for more tests. Follow these instructions at home: Eating and drinking  Eat a diet that includes fresh fruits and vegetables, whole grains, lean protein, and low-fat dairy products. Take vitamin and mineral supplements as recommended by your health care provider. Do not drink alcohol if your health care provider tells you not to drink. If you drink alcohol: Limit how much you have to 0-2 drinks a  day. Know how much alcohol is in your drink. In the [place] one drink equals one 12 oz bottle of beer (355 mL), one 5 oz glass of wine (148 mL), or one 1 oz glass of hard liquor (44 mL). Lifestyle Brush your teeth every morning and night with fluoride toothpaste. Floss one time each day. Exercise for at least 30 minutes 5 or more days each week. Do not use any products that contain nicotine or tobacco. These products include cigarettes, chewing tobacco, and vaping devices, such as e-cigarettes. If you need help quitting, ask your health care provider. Do not use drugs. If you are sexually active, practice safe sex. Use a condom or other form of protection to prevent STIs. Take aspirin only as told by your health care provider. Make sure that you understand how much to take and what form to take. Work with your health care provider to find out whether it is safe and beneficial for you to take aspirin daily. Find healthy ways to manage stress, such as: Meditation, yoga, or listening to music. Journaling. Talking to a trusted person. Spending time with friends and family. Minimize exposure to UV radiation to reduce your risk of skin cancer. Safety Always wear your seat belt while driving or riding in a vehicle. Do not drive: If you have been drinking alcohol. Do not ride with someone who has been drinking. When you are tired or distracted. While texting. If you have been using any mind-altering substances or drugs. Wear a helmet and other protective equipment during sports activities. If you have firearms in your house, make sure you follow all gun safety procedures. What's next? Go to your health care provider once a year for an annual wellness visit. Ask your health care provider how often you should have your eyes and teeth checked. Stay up to date on all vaccines. This information is not intended to replace advice given to you by your health care provider. Make sure you discuss any  questions you have with your health care provider. Document Revised: 12/13/2020 Document Reviewed: 12/13/2020 Elsevier Patient Education  2023 [employer].

## 2021-11-19 NOTE — Addendum Note (Signed)
Addended by: Dyann Kief on: 11/19/2021 12:59 PM   Modules accepted: Orders

## 2021-11-20 NOTE — Progress Notes (Signed)
Please inform patient of the following:  His A1c is high but everything else is stable.  This should come down with the Ozempic.  I would like to see him back in 3 months to recheck A1c.  We can recheck everything else in a year.

## 2021-11-21 ENCOUNTER — Telehealth: Payer: Self-pay | Admitting: *Deleted

## 2021-11-21 NOTE — Telephone Encounter (Signed)
(  [id number]) Rx #[id number] Ozempic (0.25 or 0.5 MG/DOSE) 2MG /3ML pen-injectors Waiting for determination

## 2021-11-21 NOTE — Telephone Encounter (Signed)
Urgent request received - missing info-   Missing: Quantify 68ml for 28 days or 3 pens for 28 days ?    Please call [phone] Opt 3 , opt 4 , opt 2  Will be faxing form with request as well.

## 2021-11-22 NOTE — Telephone Encounter (Signed)
Form with information faxed to [phone]

## 2021-11-22 NOTE — Telephone Encounter (Signed)
Message from Plan Effective from 11/21/2021 through 11/20/2022.

## 2021-11-27 DIAGNOSIS — Z1211 Encounter for screening for malignant neoplasm of colon: Secondary | ICD-10-CM | POA: Diagnosis not present

## 2021-12-05 LAB — COLOGUARD: COLOGUARD: NEGATIVE

## 2021-12-10 NOTE — Progress Notes (Signed)
Please inform patient of the following:  Great news! His cologuard is negative. We can recheck in 3 years.  Katina Degree. [name], MD 12/10/2021 5:09 PM

## 2022-01-22 ENCOUNTER — Ambulatory Visit (INDEPENDENT_AMBULATORY_CARE_PROVIDER_SITE_OTHER): Payer: BC Managed Care – PPO

## 2022-01-22 DIAGNOSIS — Z23 Encounter for immunization: Secondary | ICD-10-CM | POA: Diagnosis not present

## 2022-01-25 ENCOUNTER — Ambulatory Visit: Payer: BC Managed Care – PPO | Admitting: Family Medicine

## 2022-01-31 ENCOUNTER — Ambulatory Visit (INDEPENDENT_AMBULATORY_CARE_PROVIDER_SITE_OTHER): Payer: BC Managed Care – PPO | Admitting: Family Medicine

## 2022-01-31 ENCOUNTER — Telehealth: Payer: Self-pay | Admitting: Physical Medicine and Rehabilitation

## 2022-01-31 VITALS — BP 145/96 | HR 86 | Temp 97.9°F | Ht 73.0 in | Wt 318.4 lb

## 2022-01-31 DIAGNOSIS — E1165 Type 2 diabetes mellitus with hyperglycemia: Secondary | ICD-10-CM | POA: Diagnosis not present

## 2022-01-31 DIAGNOSIS — M5416 Radiculopathy, lumbar region: Secondary | ICD-10-CM

## 2022-01-31 MED ORDER — GABAPENTIN 300 MG PO CAPS
ORAL_CAPSULE | ORAL | 3 refills | Status: DC
Start: 2022-01-31 — End: 2023-03-24

## 2022-01-31 NOTE — Assessment & Plan Note (Addendum)
Still not controlled.  I had a lengthy discussion with patient and his wife regarding his treatment plan.  He has seen both neurosurgery and orthopedics over the last year.  He recently saw neurosurgery who did think he would benefit from surgery at this point.  He did have a recent MRI which showed some left-sided foraminal stenosis and improvement and has spinal canal stenosis.  He has done well with ESI in the past year.  Advised him to follow-up with orthopedics soon to have this repeated.  He is currently on gabapentin 300 mg in the morning and 600 mg at night which seems to be working well.  Will refill for him today.  He is working on weight loss and is down about 7 pounds since his last visit.  This should help with his symptoms as well.  We are also working on decreasing his A1c with Ozempic which should hopefully help some of his neuropathic symptoms as well.

## 2022-01-31 NOTE — Telephone Encounter (Signed)
Pt called requesting a call back to set an appt for injection. Please call pt at [phone].

## 2022-01-31 NOTE — Progress Notes (Signed)
   Austin Hernandez is a 56 y.o. male who presents today for an office visit.  Assessment/Plan:  Chronic Problems Addressed Today: Lumbar radiculopathy Still not controlled.  I had a lengthy discussion with patient and Austin Hernandez regarding Austin treatment plan.  He has seen both neurosurgery and orthopedics over the last year.  He recently saw neurosurgery who did think he would benefit from surgery at this point.  He did have a recent MRI which showed some left-sided foraminal stenosis and improvement and has spinal canal stenosis.  He has done well with ESI in the past year.  Advised him to follow-up with orthopedics soon to have this repeated.  He is currently on gabapentin 300 mg in the morning and 600 mg at night which seems to be working well.  Will refill for him today.  He is working on weight loss and is down about 7 pounds since Austin last visit.  This should help with Austin symptoms as well.  We are also working on decreasing Austin A1c with Ozempic which should hopefully help some of Austin neuropathic symptoms as well.  T2DM (type 2 diabetes mellitus) (HCC) He has been doing well on Ozempic for the last few weeks though still on 0.25 mg dose.  He will increase to 0.5 mg weekly with Austin next dose.  We can recheck A1c in a couple of months.     Subjective:  HPI:  See A/p for status of chronic conditions.          Objective:  Physical Exam: BP (!) 142/89   Pulse 86   Temp 97.9 F (36.6 C) (Temporal)   Ht 6\' 1"  (1.854 m)   Wt (!) 318 lb 6.4 oz (144.4 kg)   SpO2 97%   BMI 42.01 kg/m   Wt Readings from Last 3 Encounters:  01/31/22 (!) 318 lb 6.4 oz (144.4 kg)  11/19/21 (!) 325 lb 12.8 oz (147.8 kg)  06/15/21 (!) 320 lb (145.2 kg)    Gen: No acute distress, resting comfortably  Neuro: Grossly normal, moves all extremities Psych: Normal affect and thought content      Jacque Garrels M. 06/17/21, MD 01/31/2022 11:56 AM

## 2022-01-31 NOTE — Assessment & Plan Note (Signed)
He has been doing well on Ozempic for the last few weeks though still on 0.25 mg dose.  He will increase to 0.5 mg weekly with his next dose.  We can recheck A1c in a couple of months.

## 2022-01-31 NOTE — Patient Instructions (Signed)
It was very nice to see you today!  Please call to schedule an appointment with orthopedics for a steroid injection.  I will refill your gabapentin for you.  I will see back in 2 months to recheck your A1c.  Please come back to see me sooner if needed.  Take care, Dr [name]  PLEASE NOTE:  If you had any lab tests please let us know if you have not heard back within a few days. You may see your results on mychart before we have a chance to review them but we will give you a call once they are reviewed by [place]. If we ordered any referrals today, please let us know if you have not heard from their office within the next week.   Please try these tips to maintain a healthy lifestyle:  Eat at least 3 REAL meals and 1-2 snacks per day.  Aim for no more than 5 hours between eating.  If you eat breakfast, please do so within one hour of getting up.   Each meal should contain half fruits/vegetables, one quarter protein, and one quarter carbs (no bigger than a computer mouse)  Cut down on sweet beverages. This includes juice, soda, and sweet tea.   Drink at least 1 glass of water with each meal and aim for at least 8 glasses per day  Exercise at least 150 minutes every week.

## 2022-02-01 ENCOUNTER — Telehealth: Payer: Self-pay | Admitting: Physical Medicine and Rehabilitation

## 2022-02-01 NOTE — Telephone Encounter (Signed)
Patient returned call asked for a call back     [phone]

## 2022-02-20 ENCOUNTER — Telehealth: Payer: Self-pay | Admitting: Family Medicine

## 2022-02-20 NOTE — Telephone Encounter (Signed)
  LAST APPOINTMENT DATE:  01/31/22 OV with PCP   NEXT APPOINTMENT DATE: 04/10/22 OV with PCP   MEDICATION: Semaglutide,0.25 or 0.5MG /DOS, 2 MG/3ML SOPN [[id number]]   Is the patient out of medication?  Yes, first missed dosed was 02/18/22   PHARMACY: [id number] [place], [place] - [address]  [address], [place] [address]  Phone:  [phone]  Fax:  [phone]  DEA #:  [id number]

## 2022-02-21 ENCOUNTER — Other Ambulatory Visit: Payer: Self-pay | Admitting: Family Medicine

## 2022-02-21 ENCOUNTER — Other Ambulatory Visit: Payer: Self-pay | Admitting: *Deleted

## 2022-02-21 MED ORDER — SEMAGLUTIDE(0.25 OR 0.5MG/DOS) 2 MG/3ML ~~LOC~~ SOPN: 0.2500 mg | PEN_INJECTOR | SUBCUTANEOUS | 0 refills | Status: DC

## 2022-02-21 NOTE — Telephone Encounter (Signed)
[facility] send [facility]

## 2022-03-06 ENCOUNTER — Encounter: Payer: Self-pay | Admitting: Physical Medicine and Rehabilitation

## 2022-03-06 ENCOUNTER — Ambulatory Visit: Payer: Self-pay

## 2022-03-06 ENCOUNTER — Ambulatory Visit (INDEPENDENT_AMBULATORY_CARE_PROVIDER_SITE_OTHER): Payer: BC Managed Care – PPO | Admitting: Physical Medicine and Rehabilitation

## 2022-03-06 VITALS — BP 127/101 | HR 108

## 2022-03-06 DIAGNOSIS — M21371 Foot drop, right foot: Secondary | ICD-10-CM

## 2022-03-06 DIAGNOSIS — M5416 Radiculopathy, lumbar region: Secondary | ICD-10-CM

## 2022-03-06 DIAGNOSIS — M48062 Spinal stenosis, lumbar region with neurogenic claudication: Secondary | ICD-10-CM | POA: Diagnosis not present

## 2022-03-06 DIAGNOSIS — M48061 Spinal stenosis, lumbar region without neurogenic claudication: Secondary | ICD-10-CM

## 2022-03-06 MED ORDER — METHYLPREDNISOLONE ACETATE 80 MG/ML IJ SUSP
40.0000 mg | Freq: Once | INTRAMUSCULAR | Status: AC
  Administered 2022-03-06: 40 mg

## 2022-03-06 NOTE — Progress Notes (Signed)
Pt state lower back numbness that travels down to his right leg and foot. Pt sate he feels electric shock down his leg.Pt state walking, standing and sitting makes the pain worse. Pt state he takes pain meds to help ease pain and numbness.  Numeric Pain Rating Scale and Functional Assessment Average Pain 7   In the last MONTH (on 0-10 scale) has pain interfered with the following?  1. General activity like being  able to carry out your everyday physical activities such as walking, climbing stairs, carrying groceries, or moving a chair?  Rating(10)   +Driver, -BT, -Dye Allergies.

## 2022-03-06 NOTE — Patient Instructions (Signed)

## 2022-03-25 ENCOUNTER — Encounter: Payer: Self-pay | Admitting: *Deleted

## 2022-03-25 NOTE — Procedures (Signed)
Lumbosacral Transforaminal Epidural Steroid Injection - Sub-Pedicular Approach with Fluoroscopic Guidance  Patient: Austin Hernandez      Date of Birth: May 06, 1966 MRN: [id number] PCP: Vivi Barrack, MD      Visit Date: 03/06/2022   Universal Protocol:    Date/Time: 03/06/2022  Consent Given By: the patient  Position: PRONE  Additional Comments: Vital signs were monitored before and after the procedure. Patient was prepped and draped in the usual sterile fashion. The correct patient, procedure, and site was verified.   Injection Procedure Details:   Procedure diagnoses: Lumbar radiculopathy [M54.16]    Meds Administered:  Meds ordered this encounter  Medications   methylPREDNISolone acetate (DEPO-MEDROL) injection 40 mg    Laterality: Right  Location/Site: L4  Needle:5.0 in., 22 ga.  Short bevel or Quincke spinal needle  Needle Placement: Transforaminal  Findings:    -Comments: Excellent flow of contrast along the nerve, nerve root and into the epidural space.  Procedure Details: After squaring off the end-plates to get a true AP view, the C-arm was positioned so that an oblique view of the foramen as noted above was visualized. The target area is just inferior to the "nose of the scotty dog" or sub pedicular. The soft tissues overlying this structure were infiltrated with 2-3 ml. of 1% Lidocaine without Epinephrine.  The spinal needle was inserted toward the target using a "trajectory" view along the fluoroscope beam.  Under AP and lateral visualization, the needle was advanced so it did not puncture dura and was located close the 6 O'Clock position of the pedical in AP tracterory. Biplanar projections were used to confirm position. Aspiration was confirmed to be negative for CSF and/or blood. A 1-2 ml. volume of Isovue-250 was injected and flow of contrast was noted at each level. Radiographs were obtained for documentation purposes.   After attaining the desired flow  of contrast documented above, a 0.5 to 1.0 ml test dose of 0.25% Marcaine was injected into each respective transforaminal space.  The patient was observed for 90 seconds post injection.  After no sensory deficits were reported, and normal lower extremity motor function was noted,   the above injectate was administered so that equal amounts of the injectate were placed at each foramen (level) into the transforaminal epidural space.   Additional Comments:  No complications occurred Dressing: 2 x 2 sterile gauze and Band-Aid    Post-procedure details: Patient was observed during the procedure. Post-procedure instructions were reviewed.  Patient left the clinic in stable condition.

## 2022-03-25 NOTE — Progress Notes (Signed)
Austin Hernandez - 56 y.o. male MRN [id number]  Date of birth: 08/16/1965  Office Visit Note: Visit Date: 03/06/2022 PCP: Vivi Barrack, MD Referred by: Vivi Barrack, MD  Subjective: Chief Complaint  Patient presents with   Lower Back - Pain   Right Leg - Pain   HPI: Austin Hernandez is a 56 y.o. male who comes in today for evaluation and management for chronic worsening severe right leg and foot pain with numbness and weakness.  The patient was originally referred to me by Dr. Anderson Malta in our office as well as Dondra Prader, FN-P.  At the time he was having more radicular type complaints and epidural injection was not very beneficial at first.  We completed his second injection at that time in 2022 with some mild relief of symptoms but patient continued to have frank numbness and went on to have partial foot drop.  His case is interesting and that several people were following him including Dr. Dimas Chyle in sports medicine as well as Dr. Melina Schools at [facility].  He was lost to follow-up as we tried to contact the patient when he was known that he had electrodiagnostic study showing significant radiculopathy and foot drop which progressed.  Ultimately Dr. Melina Schools felt like he was not a great surgical candidate and then the patient did end up seeing Dr. Kristeen Miss.  There has been a new MRI since I last saw the patient as that is reviewed with him today.  His right leg is still interesting in the distribution being more of a stocking distribution from the knee down.  It is somewhat global.  There is no allodynia.  He has some mild weakness but really does not have significant foot drop at this point.  He does not walk with any noticeable weakness.  His biggest complaint is he just does not know really where he is putting his foot as it is very profoundly numb.  The MRI that is new actually looks better than the prior MRI.  His case remains somewhat of a conundrum.  We have tried  him with Lyrica which she did not tolerate very well as it seemed to increase his blood pressure.  He does feel like the injection we provided the last time we saw him did give him some relief of symptoms.  He does have a follow-up with Dr. Ellene Route.  Last hemoglobin A1c was 9.0.      Review of Systems  Neurological:  Positive for tingling, sensory change, focal weakness and weakness.  All other systems reviewed and are negative.  Otherwise per HPI.  Assessment & Plan: Visit Diagnoses:    ICD-10-CM   1. Lumbar radiculopathy  M54.16 XR C-ARM NO REPORT    Epidural Steroid injection    methylPREDNISolone acetate (DEPO-MEDROL) injection 40 mg    2. Spinal stenosis of lumbar region with neurogenic claudication  M48.062     3. Foraminal stenosis of lumbar region  M48.061     4. Foot drop, right  M21.371        Plan: Findings:  Chronic worsening severe numbness of the right lower extremity with electrodiagnostic study showing more L5 radiculopathy more than any type of peripheral neuropathy.  He is diabetic with hemoglobin A1c of 9.04 months ago but has no symptoms on the left.  He has had no MRI since I saw him last still showing some foraminal narrowing and narrowing of the lateral recesses but central canal was  improved with less epidural lipomatosis.  He continues to follow with Dr. Kristeen Miss at [facility] neurosurgery.  Clinically the distribution of the numbness is atypical of radiculopathy and he may have some type of complex regional pain syndrome but does not fit all that criteria.  At this point I will go ahead and repeat the epidural injection from a transforaminal approach as it seemed to help.  This was at L4.  He will follow with Dr. Kristeen Miss.  He does not need an AFO as his foot is not that week.  He may benefit from proprioceptive training at a neuro rehabilitation center.    Meds & Orders:  Meds ordered this encounter  Medications   methylPREDNISolone acetate  (DEPO-MEDROL) injection 40 mg    Orders Placed This Encounter  Procedures   XR C-ARM NO REPORT   Epidural Steroid injection    Follow-up: No follow-ups on file.   Procedures: No procedures performed  Lumbosacral Transforaminal Epidural Steroid Injection - Sub-Pedicular Approach with Fluoroscopic Guidance  Patient: Austin Hernandez      Date of Birth: [birth year] MRN: [id number] PCP: Vivi Barrack, MD      Visit Date: 03/06/2022   Universal Protocol:    Date/Time: 03/06/2022  Consent Given By: the patient  Position: PRONE  Additional Comments: Vital signs were monitored before and after the procedure. Patient was prepped and draped in the usual sterile fashion. The correct patient, procedure, and site was verified.   Injection Procedure Details:   Procedure diagnoses: Lumbar radiculopathy [M54.16]    Meds Administered:  Meds ordered this encounter  Medications   methylPREDNISolone acetate (DEPO-MEDROL) injection 40 mg    Laterality: Right  Location/Site: L4  Needle:5.0 in., 22 ga.  Short bevel or Quincke spinal needle  Needle Placement: Transforaminal  Findings:    -Comments: Excellent flow of contrast along the nerve, nerve root and into the epidural space.  Procedure Details: After squaring off the end-plates to get a true AP view, the C-arm was positioned so that an oblique view of the foramen as noted above was visualized. The target area is just inferior to the "nose of the scotty dog" or sub pedicular. The soft tissues overlying this structure were infiltrated with 2-3 ml. of 1% Lidocaine without Epinephrine.  The spinal needle was inserted toward the target using a "trajectory" view along the fluoroscope beam.  Under AP and lateral visualization, the needle was advanced so it did not puncture dura and was located close the 6 O'Clock position of the pedical in AP tracterory. Biplanar projections were used to confirm position. Aspiration was confirmed to be  negative for CSF and/or blood. A 1-2 ml. volume of Isovue-250 was injected and flow of contrast was noted at each level. Radiographs were obtained for documentation purposes.   After attaining the desired flow of contrast documented above, a 0.5 to 1.0 ml test dose of 0.25% Marcaine was injected into each respective transforaminal space.  The patient was observed for 90 seconds post injection.  After no sensory deficits were reported, and normal lower extremity motor function was noted,   the above injectate was administered so that equal amounts of the injectate were placed at each foramen (level) into the transforaminal epidural space.   Additional Comments:  No complications occurred Dressing: 2 x 2 sterile gauze and Band-Aid    Post-procedure details: Patient was observed during the procedure. Post-procedure instructions were reviewed.  Patient left the clinic in stable condition.    Clinical History:  MRI LUMBAR SPINE WITHOUT CONTRAST   TECHNIQUE: Multiplanar, multisequence MR imaging of the lumbar spine was performed. No intravenous contrast was administered.   COMPARISON:  Lumbar spine MRI 10/25/2020, lumbar spine CT myelogram 06/15/2021   FINDINGS: Segmentation: Standard; the lowest formed disc space is designated L5-S1.   Alignment: There is trace grade 1 retrolisthesis of L2 on L3, L4 on L5, and L5 on S1, unchanged compared to the prior CT or MRI.   Vertebrae: Vertebral body heights are preserved. Background marrow signal is normal. There is mild degenerative endplate marrow signal abnormality without edema at L4-L5. There is no suspicious marrow signal abnormality or marrow edema.   Conus medullaris and cauda equina: Conus extends to the L1 level. Conus and cauda equina appear normal.   Paraspinal and other soft tissues: There is aneurysmal dilation of the left common iliac artery measuring up to 2.2 cm. The infrarenal abdominal aorta is nonaneurysmal. The  paraspinal soft tissues are unremarkable.   Disc levels:   There is disc desiccation and narrowing at L4-L5 and L5-S1, not significantly changed.   T12-L1: No significant spinal canal or neural foraminal stenosis.   L1-L2: There is a minimal central disc protrusion without significant spinal canal or neural foraminal stenosis   L2-L3: There is a mild disc bulge and minimal degenerative endplate change and facet arthropathy without significant spinal canal or neural foraminal stenosis.   L3-L4: There is mild degenerative endplate change and bilateral facet arthropathy resulting in mild left and no significant right neural foraminal stenosis and no significant spinal canal stenosis   L4-L5: There is a mild disc bulge, degenerative endplate change, and bilateral facet arthropathy resulting in mild narrowing of the subarticular zones, left worse than right, without evidence of frank nerve root impingement, and moderate left and mild right neural foraminal stenosis. Compared to the MRI from 10/25/2020, the spinal canal stenosis is improved. The neural foraminal stenosis is not significantly changed.   L5-S1: There is a disc bulge eccentric to the left with a left foraminal component and mild bilateral facet arthropathy resulting in moderate left and mild right neural foraminal stenosis without significant spinal canal stenosis. There is possible contact of the exiting L5 nerve root by disc material, unchanged.   Aside from at L4-L5, the above findings are not significantly changed compared to the prior CT or MRI.   IMPRESSION: 1. Degenerative changes at L4-L5 with mild narrowing of the subarticular zones but no frank nerve root impingement and moderate left and mild right neural foraminal stenosis. Compared to the MRI from 10/25/2020, the spinal canal stenosis appears improved, while the neural foraminal stenosis is unchanged. 2. Disc bulge eccentric to the left at L5-S1  resulting in moderate left neural foraminal stenosis with possible contact of the exiting L5 nerve root by disc material, unchanged. 3. Mild degenerative changes at the other levels as above without other significant spinal canal or neural foraminal stenosis. No evidence of right-sided nerve root impingement to explain the patient's right radiculopathy. 4. Aneurysmal dilation of the left common iliac artery measuring up to 2.2 cm. Consider referral to a vascular specialist.     Electronically Signed   By: Valetta Mole M.D.   On: 11/05/2021 17:02   He reports that he quit smoking about 11 years ago. His smoking use included cigarettes. He has a 25.00 pack-year smoking history. He has never used smokeless tobacco.  Recent Labs    11/19/21 1140  HGBA1C 9.0*    Objective:  VS:  HT:    WT:   BMI:     BP: (!) 127/101  HR: (!) 108bpm  TEMP: ( )  RESP:  Physical Exam Vitals and nursing note reviewed.  Constitutional:      General: He is not in acute distress.    Appearance: Normal appearance. He is obese. He is not ill-appearing.  HENT:     Head: Normocephalic and atraumatic.     Right Ear: External ear normal.     Left Ear: External ear normal.     Nose: No congestion.  Eyes:     Extraocular Movements: Extraocular movements intact.  Cardiovascular:     Rate and Rhythm: Normal rate.     Pulses: Normal pulses.  Pulmonary:     Effort: Pulmonary effort is normal. No respiratory distress.  Abdominal:     General: There is no distension.     Palpations: Abdomen is soft.  Musculoskeletal:        General: No tenderness or signs of injury.     Cervical back: Neck supple.     Right lower leg: No edema.     Left lower leg: No edema.     Comments: Patient ambulates without a cane and somewhat slow to rise from seated position.  He does not walk with any type of steppage gait or foot slapping.  He does have some discoordination with the right foot.  He has decreased sensation to  light touch in all dermatomal patterns globally.  This is really more from the knee down.  Again he has decent strength on the right but is somewhat weaker than the left.  Skin:    Findings: No erythema or rash.  Neurological:     General: No focal deficit present.     Mental Status: He is alert and oriented to person, place, and time.     Cranial Nerves: No cranial nerve deficit.     Sensory: Sensory deficit present.     Motor: Weakness present. No abnormal muscle tone.     Coordination: Coordination normal.     Gait: Gait abnormal.  Psychiatric:        Mood and Affect: Mood normal.        Behavior: Behavior normal.     Ortho Exam  Imaging: No results found.  Past Medical/Family/Surgical/Social History: Medications & Allergies reviewed per EMR, new medications updated. Patient Active Problem List   Diagnosis Date Noted   Morbid obesity ([place]) 04/02/2021   Iliac artery aneurysm, bilateral ([place]) 12/07/2020   Lumbar radiculopathy 12/07/2020   Recurrent low back pain 05/18/2020   OSA (obstructive sleep apnea) 12/11/2016   T2DM (type 2 diabetes mellitus) ([place]) 12/09/2016   History of cold sores 03/28/2008   Dyslipidemia associated with type 2 diabetes mellitus ([place]) 03/28/2008   Allergic rhinitis 03/28/2008   Past Medical History:  Diagnosis Date   Back pain    GERD (gastroesophageal reflux disease)    History of chicken pox    HLD (hyperlipidemia)    Joint pain    OSA (obstructive sleep apnea) 12/11/2016   Restless leg    Seasonal allergies    Sleep apnea    Family History  Problem Relation Age of Onset   Arthritis Mother    Hypertension Mother    Hyperlipidemia Mother    Squamous cell carcinoma Father    Cancer Father 42       throat cancer (squamous), smoker   Stroke Maternal Grandmother 21   Diabetes Maternal Grandmother  Cancer Paternal Grandfather        lung?   Coronary artery disease Neg Hx    Past Surgical History:  Procedure Laterality Date    ADENOIDECTOMY  1970   ANKLE SURGERY  1980s   torn ligaments; bilateral; separate surgeries (4 total), sports   UMBILICAL HERNIA REPAIR     Social History   Occupational History   Occupation: GENERAL MANAGER [employer]  Tobacco Use   Smoking status: Former    Packs/day: 1.00    Years: 25.00    Total pack years: 25.00    Types: Cigarettes    Quit date: 07/01/2010    Years since quitting: 11.7   Smokeless tobacco: Never  Vaping Use   Vaping Use: Never used  Substance and Sexual Activity   Alcohol use: Yes    Alcohol/week: 5.0 - 6.0 standard drinks of alcohol    Types: 5 - 6 Glasses of wine per week    Comment: Occasional (weekends)   Drug use: No   Sexual activity: Not on file

## 2022-04-01 ENCOUNTER — Other Ambulatory Visit: Payer: Self-pay | Admitting: Family Medicine

## 2022-04-01 MED ORDER — SEMAGLUTIDE(0.25 OR 0.5MG/DOS) 2 MG/3ML ~~LOC~~ SOPN: 0.2500 mg | PEN_INJECTOR | SUBCUTANEOUS | 0 refills | Status: DC

## 2022-04-10 ENCOUNTER — Ambulatory Visit: Payer: BC Managed Care – PPO | Admitting: Family Medicine

## 2022-04-16 ENCOUNTER — Ambulatory Visit (INDEPENDENT_AMBULATORY_CARE_PROVIDER_SITE_OTHER): Payer: BC Managed Care – PPO | Admitting: Family Medicine

## 2022-04-16 ENCOUNTER — Encounter: Payer: Self-pay | Admitting: Family Medicine

## 2022-04-16 VITALS — BP 132/85 | HR 82 | Temp 97.8°F | Ht 73.0 in | Wt 319.8 lb

## 2022-04-16 DIAGNOSIS — M5416 Radiculopathy, lumbar region: Secondary | ICD-10-CM | POA: Diagnosis not present

## 2022-04-16 DIAGNOSIS — E1169 Type 2 diabetes mellitus with other specified complication: Secondary | ICD-10-CM | POA: Diagnosis not present

## 2022-04-16 DIAGNOSIS — E785 Hyperlipidemia, unspecified: Secondary | ICD-10-CM

## 2022-04-16 DIAGNOSIS — E1165 Type 2 diabetes mellitus with hyperglycemia: Secondary | ICD-10-CM

## 2022-04-16 DIAGNOSIS — Z23 Encounter for immunization: Secondary | ICD-10-CM | POA: Diagnosis not present

## 2022-04-16 LAB — POCT GLYCOSYLATED HEMOGLOBIN (HGB A1C): Hemoglobin A1C: 7.7 % — AB (ref 4.0–5.6)

## 2022-04-16 NOTE — Assessment & Plan Note (Signed)
Stable on Crestor 40 mg daily.  We can check lipids when he comes back for CPE.

## 2022-04-16 NOTE — Assessment & Plan Note (Addendum)
Still not controlled. Recently saw orthopedics and ESI. He has not had much of a response to this.  Recently saw neurosurgery who told him he is not a surgical candidate.  He is currently on gabapentin but was having some excessive drowsiness with this and does not want to increase the dose.  We will defer further management to orthopedics and hand neurosurgery.

## 2022-04-16 NOTE — Patient Instructions (Signed)
It was very nice to see you today!  Your A1c today is 7.7.  Please continue Ozempic 0.5 mg weekly.  We will see back in 3 to 6 months.  Come back to see [place] sooner if needed.  Take care, Dr Jerline Pain  PLEASE NOTE:  If you had any lab tests please let us know if you have not heard back within a few days. You may see your results on mychart before we have a chance to review them but we will give you a call once they are reviewed by [place]. If we ordered any referrals today, please let us know if you have not heard from their office within the next week.   Please try these tips to maintain a healthy lifestyle:  Eat at least 3 REAL meals and 1-2 snacks per day.  Aim for no more than 5 hours between eating.  If you eat breakfast, please do so within one hour of getting up.   Each meal should contain half fruits/vegetables, one quarter protein, and one quarter carbs (no bigger than a computer mouse)  Cut down on sweet beverages. This includes juice, soda, and sweet tea.   Drink at least 1 glass of water with each meal and aim for at least 8 glasses per day  Exercise at least 150 minutes every week.

## 2022-04-16 NOTE — Assessment & Plan Note (Signed)
A1c 7.7 on ozempic 0.5mg  weekly. Tolerating well. Recheck in 3-6 months.

## 2022-04-16 NOTE — Progress Notes (Signed)
   Austin Hernandez is a 56 y.o. male who presents today for an office visit.  Assessment/Plan:  Chronic Problems Addressed Today: T2DM (type 2 diabetes mellitus) (HCC) A1c 7.7 on ozempic 0.5mg  weekly. Tolerating well. Recheck in 3-6 months.   Lumbar radiculopathy Still not controlled. Recently saw orthopedics and ESI. He has not had much of a response to this.  Recently saw neurosurgery who told him he is not a surgical candidate.  He is currently on gabapentin but was having some excessive drowsiness with this and does not want to increase the dose.  We will defer further management to orthopedics and hand neurosurgery.  Dyslipidemia associated with type 2 diabetes mellitus (HCC) Stable on Crestor 40 mg daily.  We can check lipids when he comes back for CPE.  Flu shot given today.     Subjective:  HPI:  See A/p for status of chronic conditions.         Objective:  Physical Exam: BP 132/85   Pulse 82   Temp 97.8 F (36.6 C) (Temporal)   Ht 6\' 1"  (1.854 m)   Wt (!) 319 lb 12.8 oz (145.1 kg)   SpO2 95%   BMI 42.19 kg/m   Wt Readings from Last 3 Encounters:  04/16/22 (!) 319 lb 12.8 oz (145.1 kg)  01/31/22 (!) 318 lb 6.4 oz (144.4 kg)  11/19/21 (!) 325 lb 12.8 oz (147.8 kg)    Gen: No acute distress, resting comfortably CV: Regular rate and rhythm with no murmurs appreciated Pulm: Normal work of breathing, clear to auscultation bilaterally with no crackles, wheezes, or rhonchi Neuro: Grossly normal, moves all extremities Psych: Normal affect and thought content      Cherina Dhillon M. Jerline Pain, MD 04/16/2022 12:05 PM

## 2022-05-02 ENCOUNTER — Other Ambulatory Visit: Payer: Self-pay | Admitting: Family Medicine

## 2022-05-02 MED ORDER — SEMAGLUTIDE(0.25 OR 0.5MG/DOS) 2 MG/3ML ~~LOC~~ SOPN: 0.2500 mg | PEN_INJECTOR | SUBCUTANEOUS | 0 refills | Status: DC

## 2022-05-12 ENCOUNTER — Encounter: Payer: Self-pay | Admitting: Family Medicine

## 2022-05-13 NOTE — Telephone Encounter (Signed)
Please advise 

## 2022-05-13 NOTE — Telephone Encounter (Signed)
Ok to send in trulicity 1.5mg  weekly.  Katina Degree. [name], MD 05/13/2022 1:07 PM

## 2022-05-14 ENCOUNTER — Other Ambulatory Visit: Payer: Self-pay | Admitting: *Deleted

## 2022-05-14 MED ORDER — TRULICITY 1.5 MG/0.5ML ~~LOC~~ SOAJ: 1.5000 mg | SUBCUTANEOUS | 0 refills | Status: DC

## 2022-06-09 ENCOUNTER — Encounter: Payer: Self-pay | Admitting: Family Medicine

## 2022-06-10 ENCOUNTER — Other Ambulatory Visit: Payer: Self-pay | Admitting: *Deleted

## 2022-07-16 ENCOUNTER — Other Ambulatory Visit: Payer: Self-pay | Admitting: Family Medicine

## 2022-07-16 MED ORDER — TRULICITY 1.5 MG/0.5ML ~~LOC~~ SOAJ: 1.5000 mg | SUBCUTANEOUS | 0 refills | Status: DC

## 2022-07-18 ENCOUNTER — Encounter: Payer: Self-pay | Admitting: Family Medicine

## 2022-07-18 NOTE — Telephone Encounter (Signed)
Pt's spouse is asking for a call back regarding the previous msg.

## 2022-07-19 ENCOUNTER — Other Ambulatory Visit: Payer: Self-pay | Admitting: *Deleted

## 2022-07-19 DIAGNOSIS — I723 Aneurysm of iliac artery: Secondary | ICD-10-CM | POA: Diagnosis not present

## 2022-07-19 DIAGNOSIS — Z87891 Personal history of nicotine dependence: Secondary | ICD-10-CM | POA: Diagnosis not present

## 2022-07-19 DIAGNOSIS — R7303 Prediabetes: Secondary | ICD-10-CM | POA: Diagnosis not present

## 2022-07-19 DIAGNOSIS — E785 Hyperlipidemia, unspecified: Secondary | ICD-10-CM | POA: Diagnosis not present

## 2022-07-19 MED ORDER — OZEMPIC (0.25 OR 0.5 MG/DOSE) 2 MG/3ML ~~LOC~~ SOPN: 0.5000 mg | PEN_INJECTOR | SUBCUTANEOUS | 1 refills | Status: DC

## 2022-07-19 NOTE — Telephone Encounter (Signed)
Spoke with patient. Patient preference is Rx Ozempic he was using 0.5mg  weekly  Ok to change Trulicity to Ozempic 0.5  Per Dr Lelon Huh to send Rx Ozempic 0.5 Rx send to [facility]

## 2022-07-19 NOTE — Telephone Encounter (Signed)
Please clarify with patient. We can go back to ozempic at his previous dose.  Looks like he requested [place] to fill the trulicity via his patient portal on 07/16/2022.  Please remove this from his mediation list if he prefers to be on the ozempic.  Austin Hernandez. Jerline Pain, MD 07/19/2022 12:45 PM

## 2022-07-19 NOTE — Telephone Encounter (Signed)
Caller is calling for an update and requests a call from [place].

## 2022-07-19 NOTE — Telephone Encounter (Signed)
Please advise 

## 2022-08-16 ENCOUNTER — Encounter: Payer: Self-pay | Admitting: Family Medicine

## 2022-08-16 ENCOUNTER — Ambulatory Visit (INDEPENDENT_AMBULATORY_CARE_PROVIDER_SITE_OTHER): Payer: BC Managed Care – PPO | Admitting: Family Medicine

## 2022-08-16 VITALS — BP 150/93 | HR 81 | Temp 97.7°F | Ht 73.0 in | Wt 328.0 lb

## 2022-08-16 DIAGNOSIS — M5416 Radiculopathy, lumbar region: Secondary | ICD-10-CM | POA: Diagnosis not present

## 2022-08-16 DIAGNOSIS — E1165 Type 2 diabetes mellitus with hyperglycemia: Secondary | ICD-10-CM

## 2022-08-16 LAB — POCT GLYCOSYLATED HEMOGLOBIN (HGB A1C): Hemoglobin A1C: 7.9 % — AB (ref 4.0–5.6)

## 2022-08-16 MED ORDER — OZEMPIC (1 MG/DOSE) 4 MG/3ML ~~LOC~~ SOPN: 1.0000 mg | PEN_INJECTOR | SUBCUTANEOUS | 3 refills | Status: DC

## 2022-08-16 NOTE — Progress Notes (Signed)
   Austin Hernandez is a 57 y.o. male who presents today for an office visit.  Assessment/Plan:  New/Acute Problems: Elevated BP Reading Mildly elevated today.  He has intermittent elevations in the past and his most recent reading care was well-controlled.  Home readings seem to be mostly at goal as well.  We will continue to monitor and let us know if persistently elevated.  Will continue to monitor at home as well.  Will be increasing his dose of Ozempic as below and hopefully blood pressure will come down with some weight loss.  If his blood pressure is persistently elevated will need to start antihypertensives.  Chronic Problems Addressed Today: T2DM (type 2 diabetes mellitus) ([place]) A1c slightly elevated compared to last time at 7.9.  We discussed treatment options.  Will increase his Ozempic to 1 mg weekly.  He is tolerating Ozempic well and is agreeable to go up on the dose.  We discussed lifestyle modifications.  He has been having some issue with insurance coverage and he will check with them to make sure that this is covered.  He will come back in 3 months and we can recheck A1c at that time.  Lumbar radiculopathy Still having ongoing issues with right leg numbness and weakness.  Has been following with orthopedics and neurosurgery for this.  Symptoms do seem to be improving recently.  We did discuss referral back to see neurology however he would like to hold off on this for now.  We discussed reasons to return to care.     Subjective:  HPI:  See A/p for status of chronic conditions.   He is here today for follow up for his diabetes. We last saw him about 4 months ago for this. Since our last visit we did briefly switch him to trulicity due to having supply issues with ozempic. We recently switched him back to ozempic a few weeks ago.  He has been tolerating 0.5 mg weekly well without any side effects.       Objective:  Physical Exam: BP (!) 150/93   Pulse 81   Temp 97.7 F  (36.5 C) (Temporal)   Ht 6' 1"$  (1.854 m)   Wt (!) 328 lb (148.8 kg)   SpO2 95%   BMI 43.27 kg/m   Wt Readings from Last 3 Encounters:  08/16/22 (!) 328 lb (148.8 kg)  04/16/22 (!) 319 lb 12.8 oz (145.1 kg)  01/31/22 (!) 318 lb 6.4 oz (144.4 kg)    Gen: No acute distress, resting comfortably CV: Regular rate and rhythm with no murmurs appreciated Pulm: Normal work of breathing, clear to auscultation bilaterally with no crackles, wheezes, or rhonchi Neuro: Grossly normal, moves all extremities Psych: Normal affect and thought content      Austin Hernandez M. Jerline Pain, MD 08/16/2022 11:59 AM

## 2022-08-16 NOTE — Assessment & Plan Note (Signed)
Still having ongoing issues with right leg numbness and weakness.  Has been following with orthopedics and neurosurgery for this.  Symptoms do seem to be improving recently.  We did discuss referral back to see neurology however he would like to hold off on this for now.  We discussed reasons to return to care.

## 2022-08-16 NOTE — Assessment & Plan Note (Signed)
A1c slightly elevated compared to last time at 7.9.  We discussed treatment options.  Will increase his Ozempic to 1 mg weekly.  He is tolerating Ozempic well and is agreeable to go up on the dose.  We discussed lifestyle modifications.  He has been having some issue with insurance coverage and he will check with them to make sure that this is covered.  He will come back in 3 months and we can recheck A1c at that time.

## 2022-08-16 NOTE — Patient Instructions (Signed)
It was very nice to see you today!  We we will increase your Ozempic to 1 mg weekly.  Please make sure that you are getting plenty of water and working on diet and exercise.  Please come back in 3 months to recheck your A1c.  Come back sooner if needed.  Take care, Dr Jerline Pain  PLEASE NOTE:  If you had any lab tests, please let us know if you have not heard back within a few days. You may see your results on mychart before we have a chance to review them but we will give you a call once they are reviewed by [place].   If we ordered any referrals today, please let us know if you have not heard from their office within the next week.   If you had any urgent prescriptions sent in today, please check with the pharmacy within an hour of our visit to make sure the prescription was transmitted appropriately.   Please try these tips to maintain a healthy lifestyle:  Eat at least 3 REAL meals and 1-2 snacks per day.  Aim for no more than 5 hours between eating.  If you eat breakfast, please do so within one hour of getting up.   Each meal should contain half fruits/vegetables, one quarter protein, and one quarter carbs (no bigger than a computer mouse)  Cut down on sweet beverages. This includes juice, soda, and sweet tea.   Drink at least 1 glass of water with each meal and aim for at least 8 glasses per day  Exercise at least 150 minutes every week.

## 2022-08-20 ENCOUNTER — Other Ambulatory Visit: Payer: Self-pay | Admitting: *Deleted

## 2022-08-20 ENCOUNTER — Telehealth: Payer: Self-pay | Admitting: Family Medicine

## 2022-08-20 MED ORDER — OZEMPIC (1 MG/DOSE) 4 MG/3ML ~~LOC~~ SOPN: 1.0000 mg | PEN_INJECTOR | SUBCUTANEOUS | 3 refills | Status: DC

## 2022-08-20 NOTE — Telephone Encounter (Signed)
Pt's spouse called after hours to state they can not pick up rx due to diagnostic code was not included. Please advise

## 2022-08-20 NOTE — Telephone Encounter (Signed)
Patient requests to be called regarding diagnosis code submitted with RX for Ozempic

## 2022-08-20 NOTE — Telephone Encounter (Signed)
Rx resend with Dx code

## 2022-08-23 ENCOUNTER — Other Ambulatory Visit: Payer: Self-pay | Admitting: Family Medicine

## 2022-09-28 IMAGING — MR MR LUMBAR SPINE W/O CM
4 of 5 series · 13 of 48 positions shown · non-contrast
Comparison: Lumbar spine MRI 10/25/2020, lumbar spine CT myelogram
06/15/2021

CLINICAL DATA: Chronic lumbar radiculopathy, low back pain
radiating to right lower extremity for 15 months

EXAM:
MRI LUMBAR SPINE WITHOUT CONTRAST
TECHNIQUE: Multiplanar, multisequence MR imaging of the lumbar spine was
performed. No intravenous contrast was administered.

[Series 5: T2 · sagittal · 4.0mm · 0.73mm/px · 4 of 15 slices shown (1 of 2)]
[im 1/15]
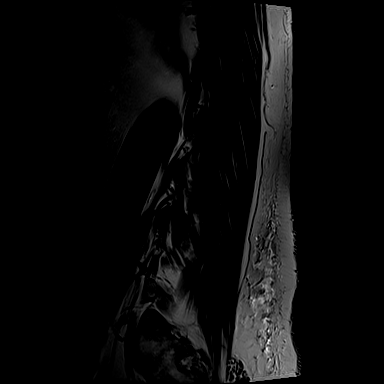
[im 3/15]
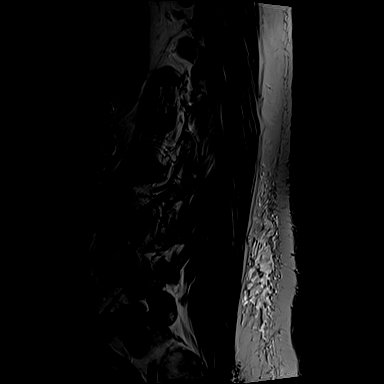
[im 9/15]
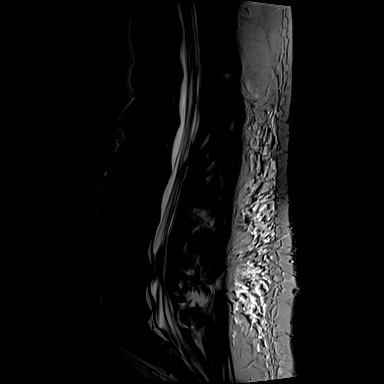
[im 15/15]
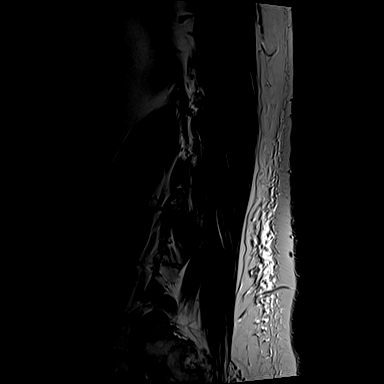

[Series 6: T1 · sagittal · 4.0mm · 0.36mm/px · 3 of 15 slices shown (1 of 2)]
[im 3/15]
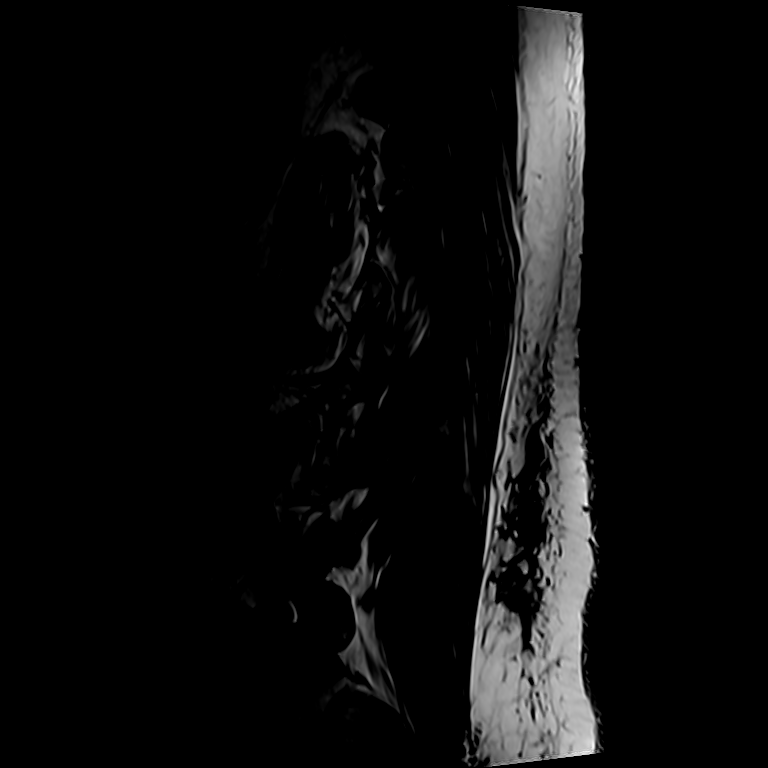
[im 9/15]
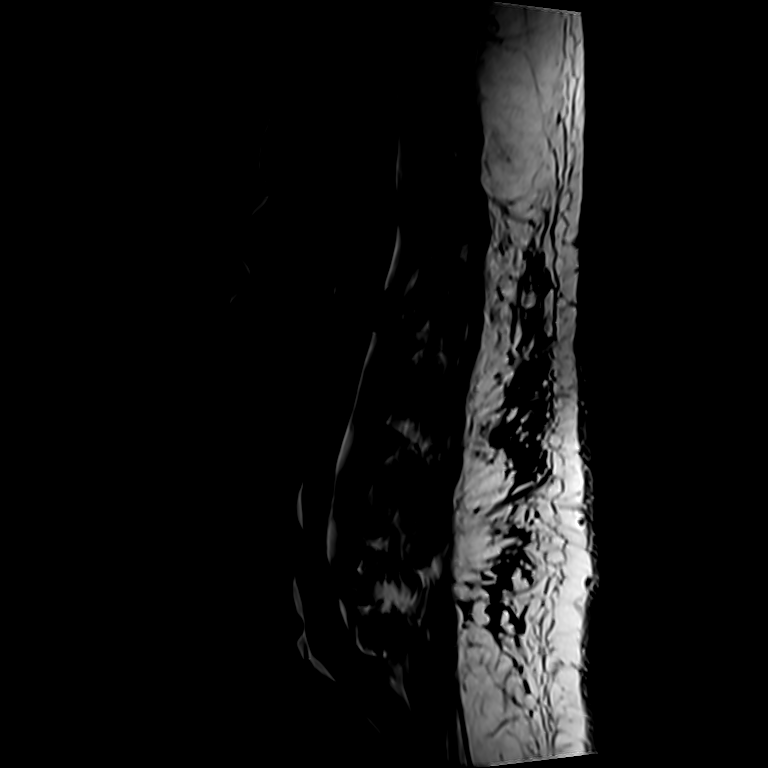
[im 15/15]
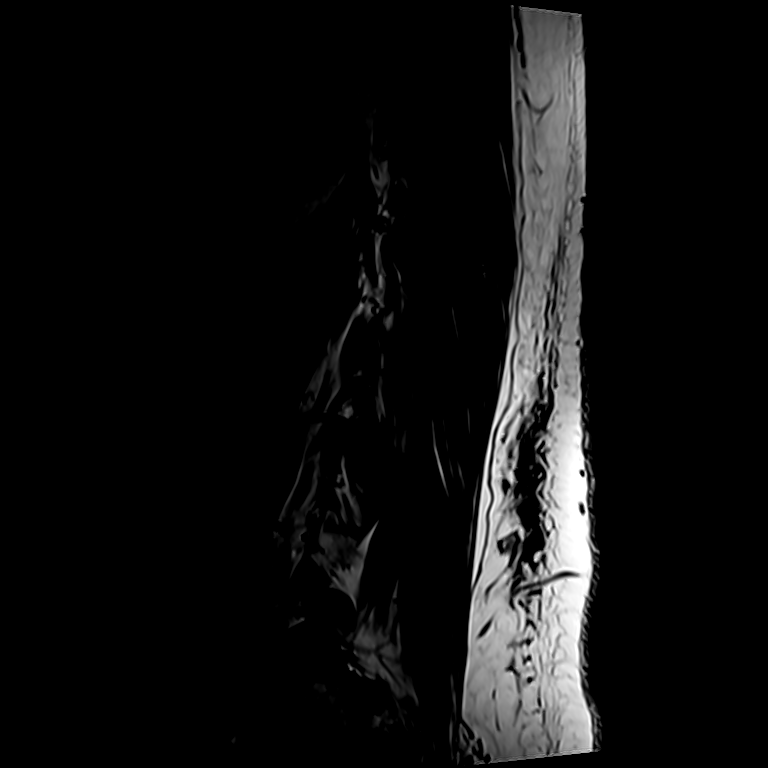

[Series 12: T2 · axial · 4.0mm · 0.28mm/px · z∈[-13,+147]mm · 3 of 41 slices shown (2 of 2)]
[im 6/41]
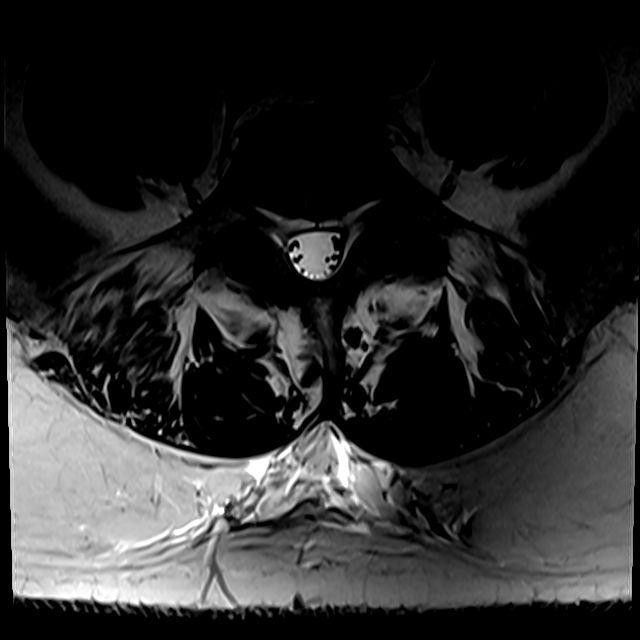
[im 21/41]
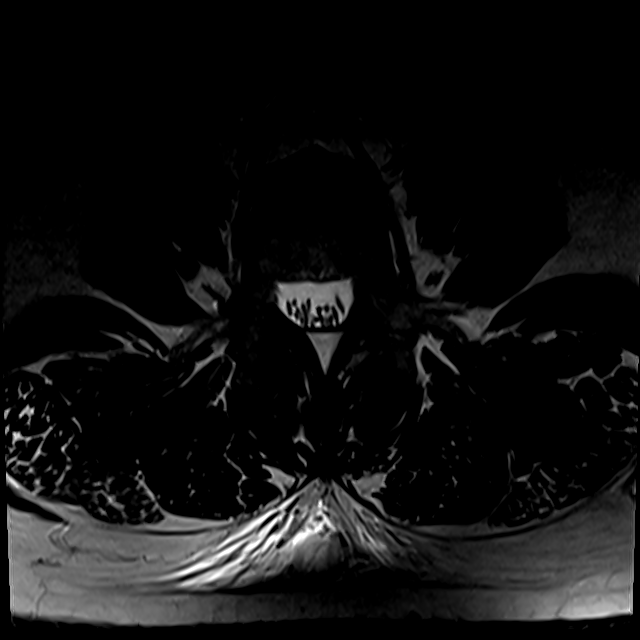
[im 35/41]
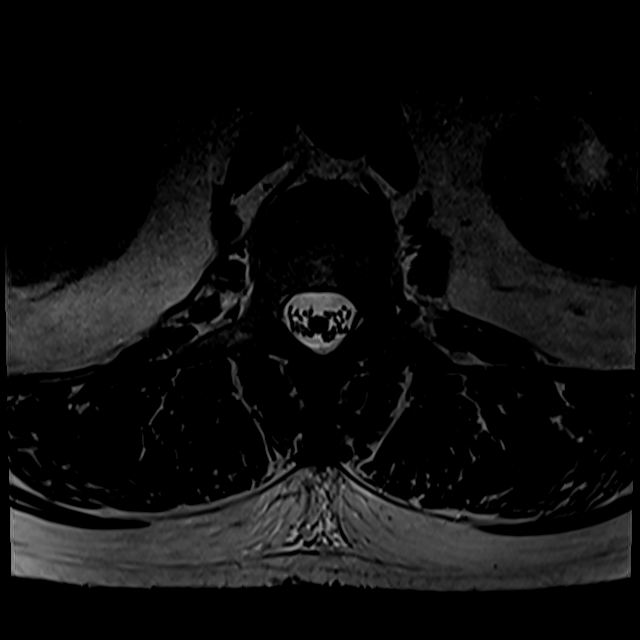

[Series 100: T1 · axial · 4.0mm · 0.28mm/px · z∈[-13,+147]mm · 3 of 41 slices shown (2 of 2)]
[im 6/41]
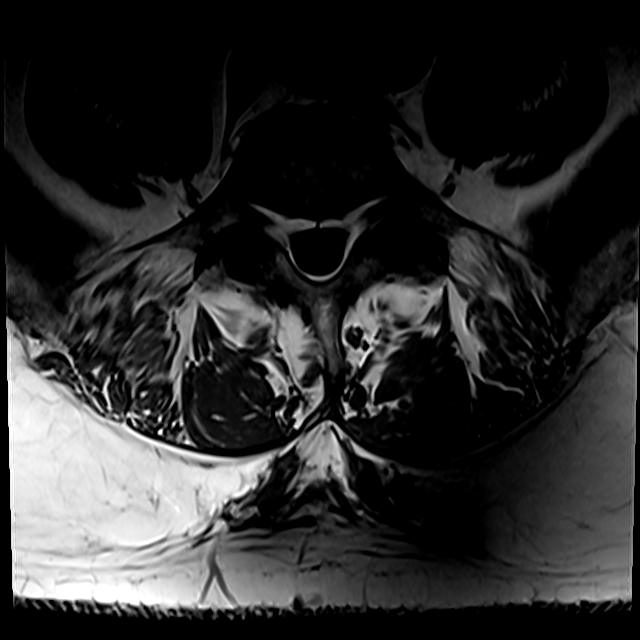
[im 21/41]
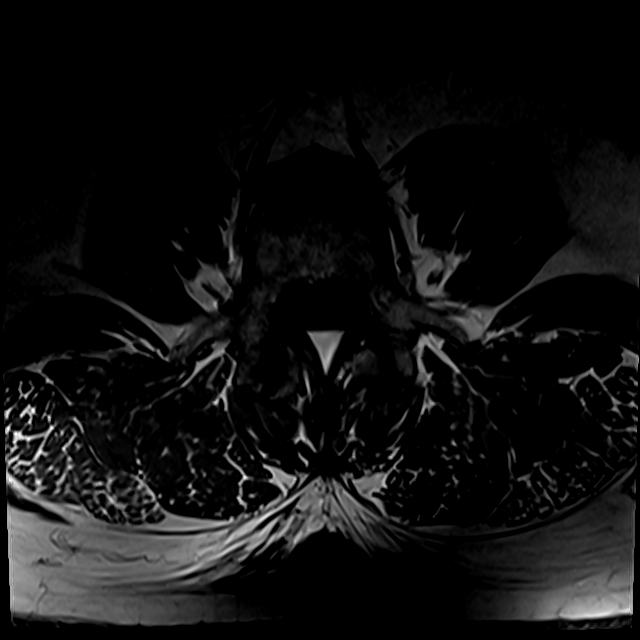
[im 35/41]
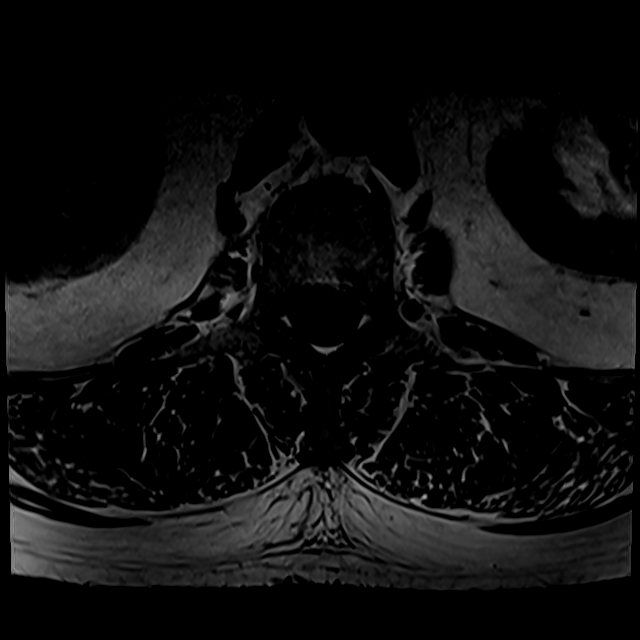

[13 of 48 positions shown; findings below may reference images not displayed]

FINDINGS: Segmentation: Standard; the lowest formed disc space is designated
L5-S1.

Alignment: There is trace grade 1 retrolisthesis of L2 on L3, L4 on
L5, and L5 on S1, unchanged compared to the prior CT or MRI.

Vertebrae: Vertebral body heights are preserved. Background marrow
signal is normal. There is mild degenerative endplate marrow signal
abnormality without edema at L4-L5. There is no suspicious marrow
signal abnormality or marrow edema.

Conus medullaris and cauda equina: Conus extends to the L1 level.
Conus and cauda equina appear normal.

Paraspinal and other soft tissues: There is aneurysmal dilation of
the left common iliac artery measuring up to 2.2 cm. The infrarenal
abdominal aorta is nonaneurysmal. The paraspinal soft tissues are
unremarkable.

Disc levels:

There is disc desiccation and narrowing at L4-L5 and L5-S1, not
significantly changed.

T12-L1: No significant spinal canal or neural foraminal stenosis.

L1-L2: There is a minimal central disc protrusion without
significant spinal canal or neural foraminal stenosis

L2-L3: There is a mild disc bulge and minimal degenerative endplate
change and facet arthropathy without significant spinal canal or
neural foraminal stenosis.

L3-L4: There is mild degenerative endplate change and bilateral
facet arthropathy resulting in mild left and no significant right
neural foraminal stenosis and no significant spinal canal stenosis

L4-L5: There is a mild disc bulge, degenerative endplate change, and
bilateral facet arthropathy resulting in mild narrowing of the
subarticular zones, left worse than right, without evidence of frank
nerve root impingement, and moderate left and mild right neural
foraminal stenosis. Compared to the MRI from 10/25/2020, the spinal
canal stenosis is improved. The neural foraminal stenosis is not
significantly changed.

L5-S1: There is a disc bulge eccentric to the left with a left
foraminal component and mild bilateral facet arthropathy resulting
in moderate left and mild right neural foraminal stenosis without
significant spinal canal stenosis. There is possible contact of the
exiting L5 nerve root by disc material, unchanged.

Aside from at L4-L5, the above findings are not significantly
changed compared to the prior CT or MRI.
IMPRESSION: 1. Degenerative changes at L4-L5 with mild narrowing of the
subarticular zones but no frank nerve root impingement and moderate
left and mild right neural foraminal stenosis. Compared to the MRI
from 10/25/2020, the spinal canal stenosis appears improved, while
the neural foraminal stenosis is unchanged.
2. Disc bulge eccentric to the left at L5-S1 resulting in moderate
left neural foraminal stenosis with possible contact of the exiting
L5 nerve root by disc material, unchanged.
3. Mild degenerative changes at the other levels as above without
other significant spinal canal or neural foraminal stenosis. No
evidence of right-sided nerve root impingement to explain the
patient's right radiculopathy.
4. Aneurysmal dilation of the left common iliac artery measuring up
to 2.2 cm. Consider referral to a vascular specialist.

## 2022-11-12 ENCOUNTER — Ambulatory Visit (INDEPENDENT_AMBULATORY_CARE_PROVIDER_SITE_OTHER): Payer: BC Managed Care – PPO | Admitting: Family Medicine

## 2022-11-12 VITALS — BP 135/88 | HR 83 | Temp 97.8°F | Ht 73.0 in | Wt 323.0 lb

## 2022-11-12 DIAGNOSIS — E1165 Type 2 diabetes mellitus with hyperglycemia: Secondary | ICD-10-CM | POA: Diagnosis not present

## 2022-11-12 DIAGNOSIS — M25562 Pain in left knee: Secondary | ICD-10-CM | POA: Diagnosis not present

## 2022-11-12 DIAGNOSIS — Z7985 Long-term (current) use of injectable non-insulin antidiabetic drugs: Secondary | ICD-10-CM | POA: Diagnosis not present

## 2022-11-12 LAB — POCT GLYCOSYLATED HEMOGLOBIN (HGB A1C): Hemoglobin A1C: 6.9 % — AB (ref 4.0–5.6)

## 2022-11-12 MED ORDER — MELOXICAM 15 MG PO TABS: 15.0000 mg | ORAL_TABLET | Freq: Every day | ORAL | 0 refills | Status: DC

## 2022-11-12 MED ORDER — SEMAGLUTIDE (2 MG/DOSE) 8 MG/3ML ~~LOC~~ SOPN: 2.0000 mg | PEN_INJECTOR | SUBCUTANEOUS | 3 refills | Status: DC

## 2022-11-12 NOTE — Assessment & Plan Note (Signed)
A1c much better at 6.9.  He is doing well with Ozempic.  He is agreeable to increase dose.  We will go to 2 mg weekly.  Discussed potential side effects.  Recheck in 3 months.

## 2022-11-12 NOTE — Patient Instructions (Addendum)
It was very nice to see you today!  Please increase your Ozempic to 2 mg weekly.  I think you probably have arthritis in your knee.  Please start the meloxicam.  Please use compression and ice to the area.  Let me know if not proving in a couple of weeks.  Return in about 3 months (around 02/12/2023).   Take care, Dr [name]  PLEASE NOTE:  If you had any lab tests, please let us know if you have not heard back within a few days. You may see your results on mychart before we have a chance to review them but we will give you a call once they are reviewed by [place].   If we ordered any referrals today, please let us know if you have not heard from their office within the next week.   If you had any urgent prescriptions sent in today, please check with the pharmacy within an hour of our visit to make sure the prescription was transmitted appropriately.   Please try these tips to maintain a healthy lifestyle:  Eat at least 3 REAL meals and 1-2 snacks per day.  Aim for no more than 5 hours between eating.  If you eat breakfast, please do so within one hour of getting up.   Each meal should contain half fruits/vegetables, one quarter protein, and one quarter carbs (no bigger than a computer mouse)  Cut down on sweet beverages. This includes juice, soda, and sweet tea.   Drink at least 1 glass of water with each meal and aim for at least 8 glasses per day  Exercise at least 150 minutes every week.

## 2022-11-12 NOTE — Progress Notes (Signed)
   Austin Hernandez is a 57 y.o. male who presents today for an office visit.  Assessment/Plan:  New/Acute Problems: Left Knee Pain No red flags. Likely has underlying osteoarthritis.  Reassuring exam today.  Will start meloxicam.  Recommended ice and compression.  He will let us know if not improving in the next couple of weeks.  Would consider imaging versus referral to orthopedics or sports medicine at that time.  Chronic Problems Addressed Today: T2DM (type 2 diabetes mellitus) (HCC) A1c much better at 6.9.  He is doing well with Ozempic.  He is agreeable to increase dose.  We will go to 2 mg weekly.  Discussed potential side effects.  Recheck in 3 months.  Morbid obesity (HCC) He is down about 5 pounds since last time.  We are increasing dose of Ozempic as above to 2 mg weekly.  Follow-up again in 3 months.     Subjective:  HPI:  See Assessment / plan for status of chronic conditions.    He was last here about 3 months ago. At our last visit, we increase his dose of ozempic to 1 mg weekly.  He has done well with this.  No significant side effects.  He has been having some left sided knee pain for the last couple of weeks. He did play a round of golf a couple of weeks ago. Did well with this intially but started getting more pain the day after. Voltaren gave only modest benefit. No mechanical symptoms. No swelling.        Objective:  Physical Exam: BP 135/88   Pulse 83   Temp 97.8 F (36.6 C) (Temporal)   Ht 6\' 1"  (1.854 m)   Wt (!) 323 lb (146.5 kg)   SpO2 96%   BMI 42.61 kg/m   Wt Readings from Last 3 Encounters:  11/12/22 (!) 323 lb (146.5 kg)  08/16/22 (!) 328 lb (148.8 kg)  04/16/22 (!) 319 lb 12.8 oz (145.1 kg)    Gen: No acute distress, resting comfortably MUSCULOSKELETAL: - Left Knee: No deformities.  Nontender to palpation.  Crepitus with active and passive range of motion.  Stable to varus and valgus stress.  Anterior and posterior drawer signs  negative. Neuro: Grossly normal, moves all extremities Psych: Normal affect and thought content      Mishika Flippen M. [name], MD 11/12/2022 12:31 PM

## 2022-11-12 NOTE — Assessment & Plan Note (Signed)
He is down about 5 pounds since last time.  We are increasing dose of Ozempic as above to 2 mg weekly.  Follow-up again in 3 months.

## 2022-11-13 ENCOUNTER — Telehealth: Payer: Self-pay | Admitting: *Deleted

## 2022-11-13 NOTE — Telephone Encounter (Signed)
(  Key: [id number]) - [id number] Ozempic (0.25 or 0.5 MG/DOSE) 2MG /3ML pen-injectors Status: PA RequestCreated: May 10th, 2024Sent: May 15th, 2024 waiting for determination

## 2022-11-23 NOTE — Telephone Encounter (Signed)
Received fax showing denial due to no chart notes showing pt has diabetes. Resubmitted with lab notes showing elevated A1Cs.  New Key: [id number]

## 2022-11-27 NOTE — Telephone Encounter (Signed)
Rx Ozempic approved from 11/13/2022-11/13/2023

## 2022-12-09 ENCOUNTER — Other Ambulatory Visit: Payer: Self-pay | Admitting: Family Medicine

## 2022-12-27 ENCOUNTER — Ambulatory Visit (INDEPENDENT_AMBULATORY_CARE_PROVIDER_SITE_OTHER): Payer: BC Managed Care – PPO | Admitting: Family Medicine

## 2022-12-27 ENCOUNTER — Encounter: Payer: Self-pay | Admitting: Family Medicine

## 2022-12-27 VITALS — BP 134/82 | HR 86 | Temp 97.5°F | Ht 73.0 in | Wt 322.5 lb

## 2022-12-27 DIAGNOSIS — Z20818 Contact with and (suspected) exposure to other bacterial communicable diseases: Secondary | ICD-10-CM | POA: Diagnosis not present

## 2022-12-27 DIAGNOSIS — J309 Allergic rhinitis, unspecified: Secondary | ICD-10-CM

## 2022-12-27 DIAGNOSIS — E1165 Type 2 diabetes mellitus with hyperglycemia: Secondary | ICD-10-CM | POA: Diagnosis not present

## 2022-12-27 DIAGNOSIS — H9209 Otalgia, unspecified ear: Secondary | ICD-10-CM

## 2022-12-27 DIAGNOSIS — Z7985 Long-term (current) use of injectable non-insulin antidiabetic drugs: Secondary | ICD-10-CM

## 2022-12-27 MED ORDER — AZITHROMYCIN 250 MG PO TABS: ORAL_TABLET | ORAL | 0 refills | Status: DC

## 2022-12-27 NOTE — Assessment & Plan Note (Signed)
Stable on over-the-counter meds.  May be contributing some to his ear pain as above.  He wishes to see ENT with his wife.  Will place referral today.

## 2022-12-27 NOTE — Patient Instructions (Signed)
It was very nice to see you today!  Please start the azithromycin.  I will refer you to ear nose and throat.  Please avoid using Q-tips.  Return if symptoms worsen or fail to improve.   Take care, Dr [name]  PLEASE NOTE:  If you had any lab tests, please let us know if you have not heard back within a few days. You may see your results on mychart before we have a chance to review them but we will give you a call once they are reviewed by [place].   If we ordered any referrals today, please let us know if you have not heard from their office within the next week.   If you had any urgent prescriptions sent in today, please check with the pharmacy within an hour of our visit to make sure the prescription was transmitted appropriately.   Please try these tips to maintain a healthy lifestyle:  Eat at least 3 REAL meals and 1-2 snacks per day.  Aim for no more than 5 hours between eating.  If you eat breakfast, please do so within one hour of getting up.   Each meal should contain half fruits/vegetables, one quarter protein, and one quarter carbs (no bigger than a computer mouse)  Cut down on sweet beverages. This includes juice, soda, and sweet tea.   Drink at least 1 glass of water with each meal and aim for at least 8 glasses per day  Exercise at least 150 minutes every week.

## 2022-12-27 NOTE — Progress Notes (Signed)
   Karn Ledonne is a 57 y.o. male who presents today for an office visit.  Assessment/Plan:  New/Acute Problems: Exposure to pertussis Asymptomatic given close household contact we will start postexposure prophylactic antibiotics per current guidelines.  Start azithromycin.  He is up-to-date on Tdap.  Otalgia Has mild irritation in EAC likely secondary to Q-tip use.  Recommended against use.  He can use over-the-counter Debrox or hydrogen peroxide as needed to prevent wax buildup.  Chronic Problems Addressed Today: Allergic rhinitis Stable on over-the-counter meds.  May be contributing some to his ear pain as above.  He wishes to see ENT with his wife.  Will place referral today.  T2DM (type 2 diabetes mellitus) (HCC) Too early to recheck A1c today.  Last was 6.9 on Ozempic 1 mg weekly and we increased the dose to 2 mg weekly.  He has been doing well with this.  Recheck again in a few months.     Subjective:  HPI:  Patient here with his wife.  They were exposed to pertussis.  Had a friend visit from [place] within the last week.  Had close exposures at home as well as in the private vehicle.  He went back to [place] and was diagnosed with pertussis.  He is currently not having any symptoms.  No fevers or chills.  No cough.  No runny nose.  He is also occasionally getting discomfort in his ears.  This is been going on for a while.  Comes and goes.  No hearing issues.  Uses Q-tips frequently to help clean his ears.        Objective:  Physical Exam: BP 134/82 (BP Location: Left Arm, Patient Position: Sitting, Cuff Size: Large)   Pulse 86   Temp (!) 97.5 F (36.4 C) (Temporal)   Ht 6\' 1"  (1.854 m)   Wt (!) 322 lb 8 oz (146.3 kg)   SpO2 98%   BMI 42.55 kg/m   Gen: No acute distress, resting comfortably HEENT: Bilateral EACs with small amount of irritation.  TMs clear. CV: Regular rate and rhythm with no murmurs appreciated Pulm: Normal work of breathing, clear to  auscultation bilaterally with no crackles, wheezes, or rhonchi Neuro: Grossly normal, moves all extremities Psych: Normal affect and thought content      Korayma Hagwood M. [name], MD 12/27/2022 11:59 AM

## 2022-12-27 NOTE — Assessment & Plan Note (Signed)
Too early to recheck A1c today.  Last was 6.9 on Ozempic 1 mg weekly and we increased the dose to 2 mg weekly.  He has been doing well with this.  Recheck again in a few months.

## 2022-12-30 ENCOUNTER — Other Ambulatory Visit: Payer: Self-pay | Admitting: *Deleted

## 2022-12-30 MED ORDER — AZITHROMYCIN 250 MG PO TABS: ORAL_TABLET | ORAL | 0 refills | Status: DC

## 2023-01-15 ENCOUNTER — Other Ambulatory Visit: Payer: Self-pay | Admitting: Family Medicine

## 2023-01-29 ENCOUNTER — Encounter (INDEPENDENT_AMBULATORY_CARE_PROVIDER_SITE_OTHER): Payer: Self-pay

## 2023-02-13 ENCOUNTER — Institutional Professional Consult (permissible substitution) (INDEPENDENT_AMBULATORY_CARE_PROVIDER_SITE_OTHER): Payer: BC Managed Care – PPO | Admitting: Otolaryngology

## 2023-02-13 ENCOUNTER — Ambulatory Visit: Payer: BC Managed Care – PPO | Admitting: Family Medicine

## 2023-02-25 ENCOUNTER — Ambulatory Visit (INDEPENDENT_AMBULATORY_CARE_PROVIDER_SITE_OTHER): Payer: BC Managed Care – PPO | Admitting: Family Medicine

## 2023-02-25 ENCOUNTER — Other Ambulatory Visit: Payer: Self-pay | Admitting: Family Medicine

## 2023-02-25 VITALS — BP 132/84 | HR 88 | Temp 97.3°F | Ht 73.0 in | Wt 321.6 lb

## 2023-02-25 DIAGNOSIS — M5416 Radiculopathy, lumbar region: Secondary | ICD-10-CM

## 2023-02-25 DIAGNOSIS — Z6841 Body Mass Index (BMI) 40.0 and over, adult: Secondary | ICD-10-CM

## 2023-02-25 DIAGNOSIS — E119 Type 2 diabetes mellitus without complications: Secondary | ICD-10-CM | POA: Diagnosis not present

## 2023-02-25 DIAGNOSIS — E1165 Type 2 diabetes mellitus with hyperglycemia: Secondary | ICD-10-CM | POA: Diagnosis not present

## 2023-02-25 DIAGNOSIS — Z7985 Long-term (current) use of injectable non-insulin antidiabetic drugs: Secondary | ICD-10-CM | POA: Diagnosis not present

## 2023-02-25 DIAGNOSIS — Z23 Encounter for immunization: Secondary | ICD-10-CM

## 2023-02-25 LAB — MICROALBUMIN / CREATININE URINE RATIO
Creatinine,U: 397.1 mg/dL
Microalb Creat Ratio: 0.9 mg/g (ref 0.0–30.0)
Microalb, Ur: 3.8 mg/dL — ABNORMAL HIGH (ref 0.0–1.9)

## 2023-02-25 LAB — POCT GLYCOSYLATED HEMOGLOBIN (HGB A1C): Hemoglobin A1C: 6.6 % — AB (ref 4.0–5.6)

## 2023-02-25 MED ORDER — TIRZEPATIDE 15 MG/0.5ML ~~LOC~~ SOAJ
15.0000 mg | SUBCUTANEOUS | 3 refills | Status: DC
Start: 2023-02-25 — End: 2023-04-15

## 2023-02-25 NOTE — Patient Instructions (Signed)
It was very nice to see you today!  We gave you your flu shot today.  Your A1c is 6.6.  We will switch your Ozempic to [facility].  Send me a message in a few weeks to let me know how this is working for you.  Return in about 3 months (around 05/28/2023) for Follow Up.   Take care, Dr [name]  PLEASE NOTE:  If you had any lab tests, please let us know if you have not heard back within a few days. You may see your results on mychart before we have a chance to review them but we will give you a call once they are reviewed by [place].   If we ordered any referrals today, please let us know if you have not heard from their office within the next week.   If you had any urgent prescriptions sent in today, please check with the pharmacy within an hour of our visit to make sure the prescription was transmitted appropriately.   Please try these tips to maintain a healthy lifestyle:  Eat at least 3 REAL meals and 1-2 snacks per day.  Aim for no more than 5 hours between eating.  If you eat breakfast, please do so within one hour of getting up.   Each meal should contain half fruits/vegetables, one quarter protein, and one quarter carbs (no bigger than a computer mouse)  Cut down on sweet beverages. This includes juice, soda, and sweet tea.   Drink at least 1 glass of water with each meal and aim for at least 8 glasses per day  Exercise at least 150 minutes every week.

## 2023-02-25 NOTE — Assessment & Plan Note (Signed)
A1c 6.6 on Ozempic 2 mg weekly.  He does feel like this medication is working well although is interested in trying alternative to help with weight loss.  We will switch to Mounjaro 15 mg weekly.  We discussed potential side effects.  He will follow-up in a few weeks via MyChart.  Recheck A1c in 3 months.

## 2023-02-25 NOTE — Assessment & Plan Note (Signed)
Patient down about 2 pounds since last visit.  We discussed lifestyle modifications.  He will work on cutting down on sweets.  Will be switching his Ozempic to [facility] as above which should hopefully work better for him for weight loss.  He will follow-up in a few weeks via MyChart as above.  Recheck at next office visit in 3 months.

## 2023-02-25 NOTE — Assessment & Plan Note (Signed)
Will be seeing neurosurgery soon for this.  Overall symptoms are stable though still does have persistent pain in bilateral legs.

## 2023-02-25 NOTE — Progress Notes (Signed)
   Austin Hernandez is a 57 y.o. male who presents today for an office visit.  Assessment/Plan:  Chronic Problems Addressed Today: T2DM (type 2 diabetes mellitus) (HCC) A1c 6.6 on Ozempic 2 mg weekly.  He does feel like this medication is working well although is interested in trying alternative to help with weight loss.  We will switch to Mounjaro 15 mg weekly.  We discussed potential side effects.  He will follow-up in a few weeks via MyChart.  Recheck A1c in 3 months.  Lumbar radiculopathy Will be seeing neurosurgery soon for this.  Overall symptoms are stable though still does have persistent pain in bilateral legs.  Morbid obesity (HCC) Patient down about 2 pounds since last visit.  We discussed lifestyle modifications.  He will work on cutting down on sweets.  Will be switching his Ozempic to [facility] as above which should hopefully work better for him for weight loss.  He will follow-up in a few weeks via MyChart as above.  Recheck at next office visit in 3 months.  Flu vaccine given today.    Subjective:  HPI:  See A/P for status of chronic conditions.  Patient is here today for follow-up.  Seen 3 months ago.  At that time A1c was 6.9 on Ozempic.  We increased dose to 2 mg weekly.  He has done well with this.       Objective:  Physical Exam: BP 132/84   Pulse 88   Temp (!) 97.3 F (36.3 C) (Temporal)   Ht 6\' 1"  (1.854 m)   Wt (!) 321 lb 9.6 oz (145.9 kg)   SpO2 93%   BMI 42.43 kg/m   Wt Readings from Last 3 Encounters:  02/25/23 (!) 321 lb 9.6 oz (145.9 kg)  12/27/22 (!) 322 lb 8 oz (146.3 kg)  11/12/22 (!) 323 lb (146.5 kg)  Gen: No acute distress, resting comfortably CV: Regular rate and rhythm with no murmurs appreciated Pulm: Normal work of breathing, clear to auscultation bilaterally with no crackles, wheezes, or rhonchi Neuro: Grossly normal, moves all extremities Psych: Normal affect and thought content      Roseann Kees M. [name], MD 02/25/2023 11:41 AM

## 2023-02-26 DIAGNOSIS — M9902 Segmental and somatic dysfunction of thoracic region: Secondary | ICD-10-CM | POA: Diagnosis not present

## 2023-02-26 DIAGNOSIS — M9903 Segmental and somatic dysfunction of lumbar region: Secondary | ICD-10-CM | POA: Diagnosis not present

## 2023-02-26 DIAGNOSIS — M9905 Segmental and somatic dysfunction of pelvic region: Secondary | ICD-10-CM | POA: Diagnosis not present

## 2023-02-26 DIAGNOSIS — M9904 Segmental and somatic dysfunction of sacral region: Secondary | ICD-10-CM | POA: Diagnosis not present

## 2023-02-27 NOTE — Progress Notes (Signed)
Urine test is normal. We can recheck in a year.  Austin Hernandez. [name], MD 02/27/2023 10:02 AM

## 2023-02-28 DIAGNOSIS — M9905 Segmental and somatic dysfunction of pelvic region: Secondary | ICD-10-CM | POA: Diagnosis not present

## 2023-02-28 DIAGNOSIS — M9904 Segmental and somatic dysfunction of sacral region: Secondary | ICD-10-CM | POA: Diagnosis not present

## 2023-02-28 DIAGNOSIS — M9902 Segmental and somatic dysfunction of thoracic region: Secondary | ICD-10-CM | POA: Diagnosis not present

## 2023-02-28 DIAGNOSIS — M9903 Segmental and somatic dysfunction of lumbar region: Secondary | ICD-10-CM | POA: Diagnosis not present

## 2023-03-03 DIAGNOSIS — M9904 Segmental and somatic dysfunction of sacral region: Secondary | ICD-10-CM | POA: Diagnosis not present

## 2023-03-03 DIAGNOSIS — M9902 Segmental and somatic dysfunction of thoracic region: Secondary | ICD-10-CM | POA: Diagnosis not present

## 2023-03-03 DIAGNOSIS — M9903 Segmental and somatic dysfunction of lumbar region: Secondary | ICD-10-CM | POA: Diagnosis not present

## 2023-03-03 DIAGNOSIS — M9905 Segmental and somatic dysfunction of pelvic region: Secondary | ICD-10-CM | POA: Diagnosis not present

## 2023-03-04 NOTE — Telephone Encounter (Signed)
Alternative Requested

## 2023-03-04 NOTE — Telephone Encounter (Signed)
He will have to go back to Ozempic 2 mg weekly if insurance will not pay for [name].  Austin Hernandez. Austin Ralph, MD 03/04/2023 10:29 AM

## 2023-03-07 DIAGNOSIS — M9902 Segmental and somatic dysfunction of thoracic region: Secondary | ICD-10-CM | POA: Diagnosis not present

## 2023-03-07 DIAGNOSIS — M9903 Segmental and somatic dysfunction of lumbar region: Secondary | ICD-10-CM | POA: Diagnosis not present

## 2023-03-07 DIAGNOSIS — M9904 Segmental and somatic dysfunction of sacral region: Secondary | ICD-10-CM | POA: Diagnosis not present

## 2023-03-07 DIAGNOSIS — M9905 Segmental and somatic dysfunction of pelvic region: Secondary | ICD-10-CM | POA: Diagnosis not present

## 2023-03-10 DIAGNOSIS — M9902 Segmental and somatic dysfunction of thoracic region: Secondary | ICD-10-CM | POA: Diagnosis not present

## 2023-03-10 DIAGNOSIS — M9905 Segmental and somatic dysfunction of pelvic region: Secondary | ICD-10-CM | POA: Diagnosis not present

## 2023-03-10 DIAGNOSIS — M9903 Segmental and somatic dysfunction of lumbar region: Secondary | ICD-10-CM | POA: Diagnosis not present

## 2023-03-10 DIAGNOSIS — M9904 Segmental and somatic dysfunction of sacral region: Secondary | ICD-10-CM | POA: Diagnosis not present

## 2023-03-14 DIAGNOSIS — M9902 Segmental and somatic dysfunction of thoracic region: Secondary | ICD-10-CM | POA: Diagnosis not present

## 2023-03-14 DIAGNOSIS — M9903 Segmental and somatic dysfunction of lumbar region: Secondary | ICD-10-CM | POA: Diagnosis not present

## 2023-03-14 DIAGNOSIS — M9905 Segmental and somatic dysfunction of pelvic region: Secondary | ICD-10-CM | POA: Diagnosis not present

## 2023-03-14 DIAGNOSIS — M9904 Segmental and somatic dysfunction of sacral region: Secondary | ICD-10-CM | POA: Diagnosis not present

## 2023-03-17 DIAGNOSIS — M9904 Segmental and somatic dysfunction of sacral region: Secondary | ICD-10-CM | POA: Diagnosis not present

## 2023-03-17 DIAGNOSIS — M9902 Segmental and somatic dysfunction of thoracic region: Secondary | ICD-10-CM | POA: Diagnosis not present

## 2023-03-17 DIAGNOSIS — M9905 Segmental and somatic dysfunction of pelvic region: Secondary | ICD-10-CM | POA: Diagnosis not present

## 2023-03-17 DIAGNOSIS — M9903 Segmental and somatic dysfunction of lumbar region: Secondary | ICD-10-CM | POA: Diagnosis not present

## 2023-03-20 DIAGNOSIS — M9902 Segmental and somatic dysfunction of thoracic region: Secondary | ICD-10-CM | POA: Diagnosis not present

## 2023-03-20 DIAGNOSIS — M9904 Segmental and somatic dysfunction of sacral region: Secondary | ICD-10-CM | POA: Diagnosis not present

## 2023-03-20 DIAGNOSIS — M9903 Segmental and somatic dysfunction of lumbar region: Secondary | ICD-10-CM | POA: Diagnosis not present

## 2023-03-20 DIAGNOSIS — M9905 Segmental and somatic dysfunction of pelvic region: Secondary | ICD-10-CM | POA: Diagnosis not present

## 2023-03-21 ENCOUNTER — Institutional Professional Consult (permissible substitution) (INDEPENDENT_AMBULATORY_CARE_PROVIDER_SITE_OTHER): Payer: BC Managed Care – PPO | Admitting: Otolaryngology

## 2023-03-22 ENCOUNTER — Encounter: Payer: Self-pay | Admitting: Family Medicine

## 2023-03-22 ENCOUNTER — Other Ambulatory Visit: Payer: Self-pay | Admitting: Family Medicine

## 2023-03-24 MED ORDER — GABAPENTIN 300 MG PO CAPS: ORAL_CAPSULE | ORAL | 3 refills | Status: DC

## 2023-03-24 NOTE — Telephone Encounter (Signed)
Pt requesting refill for Gabapentin, Last OV 02/25/2023.

## 2023-04-04 DIAGNOSIS — M9905 Segmental and somatic dysfunction of pelvic region: Secondary | ICD-10-CM | POA: Diagnosis not present

## 2023-04-04 DIAGNOSIS — M9902 Segmental and somatic dysfunction of thoracic region: Secondary | ICD-10-CM | POA: Diagnosis not present

## 2023-04-04 DIAGNOSIS — M9904 Segmental and somatic dysfunction of sacral region: Secondary | ICD-10-CM | POA: Diagnosis not present

## 2023-04-04 DIAGNOSIS — M9903 Segmental and somatic dysfunction of lumbar region: Secondary | ICD-10-CM | POA: Diagnosis not present

## 2023-04-07 DIAGNOSIS — M9905 Segmental and somatic dysfunction of pelvic region: Secondary | ICD-10-CM | POA: Diagnosis not present

## 2023-04-07 DIAGNOSIS — M9902 Segmental and somatic dysfunction of thoracic region: Secondary | ICD-10-CM | POA: Diagnosis not present

## 2023-04-07 DIAGNOSIS — M9904 Segmental and somatic dysfunction of sacral region: Secondary | ICD-10-CM | POA: Diagnosis not present

## 2023-04-07 DIAGNOSIS — M9903 Segmental and somatic dysfunction of lumbar region: Secondary | ICD-10-CM | POA: Diagnosis not present

## 2023-04-11 DIAGNOSIS — M9904 Segmental and somatic dysfunction of sacral region: Secondary | ICD-10-CM | POA: Diagnosis not present

## 2023-04-11 DIAGNOSIS — M9905 Segmental and somatic dysfunction of pelvic region: Secondary | ICD-10-CM | POA: Diagnosis not present

## 2023-04-11 DIAGNOSIS — M9903 Segmental and somatic dysfunction of lumbar region: Secondary | ICD-10-CM | POA: Diagnosis not present

## 2023-04-11 DIAGNOSIS — M9902 Segmental and somatic dysfunction of thoracic region: Secondary | ICD-10-CM | POA: Diagnosis not present

## 2023-04-14 ENCOUNTER — Encounter: Payer: Self-pay | Admitting: Family Medicine

## 2023-04-14 DIAGNOSIS — M9904 Segmental and somatic dysfunction of sacral region: Secondary | ICD-10-CM | POA: Diagnosis not present

## 2023-04-14 DIAGNOSIS — M9905 Segmental and somatic dysfunction of pelvic region: Secondary | ICD-10-CM | POA: Diagnosis not present

## 2023-04-14 DIAGNOSIS — M9902 Segmental and somatic dysfunction of thoracic region: Secondary | ICD-10-CM | POA: Diagnosis not present

## 2023-04-14 DIAGNOSIS — M9903 Segmental and somatic dysfunction of lumbar region: Secondary | ICD-10-CM | POA: Diagnosis not present

## 2023-04-15 ENCOUNTER — Other Ambulatory Visit: Payer: Self-pay

## 2023-04-15 MED ORDER — TIRZEPATIDE 15 MG/0.5ML ~~LOC~~ SOAJ: 15.0000 mg | SUBCUTANEOUS | 3 refills | Status: DC

## 2023-04-22 ENCOUNTER — Other Ambulatory Visit (HOSPITAL_COMMUNITY): Payer: Self-pay

## 2023-04-22 DIAGNOSIS — M9902 Segmental and somatic dysfunction of thoracic region: Secondary | ICD-10-CM | POA: Diagnosis not present

## 2023-04-22 DIAGNOSIS — M9905 Segmental and somatic dysfunction of pelvic region: Secondary | ICD-10-CM | POA: Diagnosis not present

## 2023-04-22 DIAGNOSIS — M9903 Segmental and somatic dysfunction of lumbar region: Secondary | ICD-10-CM | POA: Diagnosis not present

## 2023-04-22 DIAGNOSIS — M9904 Segmental and somatic dysfunction of sacral region: Secondary | ICD-10-CM | POA: Diagnosis not present

## 2023-04-23 ENCOUNTER — Telehealth: Payer: Self-pay | Admitting: *Deleted

## 2023-04-23 NOTE — Telephone Encounter (Signed)
[employer] needs a call back about this med - [phone] option 3 option 1  The faxed some paperwork as well

## 2023-04-23 NOTE — Telephone Encounter (Signed)
(  Key: [id number]) - [id number] Mounjaro 15MG /0.5ML auto-injectors status: PA RequestCreated: October 23rd, 2024 [phone]: October 23rd, 2024 Waiting for determination

## 2023-04-24 NOTE — Telephone Encounter (Signed)
[employer] called back for an update. Can be reached at number in message below.

## 2023-04-29 DIAGNOSIS — M9902 Segmental and somatic dysfunction of thoracic region: Secondary | ICD-10-CM | POA: Diagnosis not present

## 2023-04-29 DIAGNOSIS — M9904 Segmental and somatic dysfunction of sacral region: Secondary | ICD-10-CM | POA: Diagnosis not present

## 2023-04-29 DIAGNOSIS — M9903 Segmental and somatic dysfunction of lumbar region: Secondary | ICD-10-CM | POA: Diagnosis not present

## 2023-04-29 DIAGNOSIS — M9905 Segmental and somatic dysfunction of pelvic region: Secondary | ICD-10-CM | POA: Diagnosis not present

## 2023-05-01 NOTE — Telephone Encounter (Signed)
Pharmacy Patient Advocate Encounter  Received notification from [facility] that Prior Authorization for Mounjaro 15mg /0.52ml has been DENIED.  Full denial letter will be uploaded to the media tab. See denial reason below.   PA #/Case ID/Reference #: [id number]     Placed a call to the insurance and the representative stated to fax over chart notes for a provider courtesy review to [phone].  Status is pending.

## 2023-05-02 NOTE — Telephone Encounter (Signed)
Please send all A1C and note Patient Dx DM

## 2023-05-08 ENCOUNTER — Other Ambulatory Visit (HOSPITAL_COMMUNITY): Payer: Self-pay

## 2023-05-08 ENCOUNTER — Telehealth: Payer: Self-pay | Admitting: Family Medicine

## 2023-05-08 NOTE — Telephone Encounter (Signed)
Pharmacy Patient Advocate Encounter  Received notification from [facility] that Prior Authorization for Mounjaro 15mg /0.64ml has been APPROVED from 04/23/23 to 04/22/24   PA #/Case ID/Reference #: [id number]  Left a voicemail at [facility] to notify of the approval

## 2023-05-08 NOTE — Telephone Encounter (Signed)
Pts wife states insurance needs PA for the new Mounjaro meds.  Also, if he can get an RX for Ozempic until [employer]gg[employer] Keen is approved. They are going [place] of [place] and need RX to take. He only has 1 shot left. Please advise.

## 2023-05-12 ENCOUNTER — Other Ambulatory Visit: Payer: Self-pay

## 2023-05-12 MED ORDER — TIRZEPATIDE 15 MG/0.5ML ~~LOC~~ SOAJ: 15.0000 mg | SUBCUTANEOUS | 3 refills | Status: DC

## 2023-05-13 DIAGNOSIS — M9904 Segmental and somatic dysfunction of sacral region: Secondary | ICD-10-CM | POA: Diagnosis not present

## 2023-05-13 DIAGNOSIS — M9902 Segmental and somatic dysfunction of thoracic region: Secondary | ICD-10-CM | POA: Diagnosis not present

## 2023-05-13 DIAGNOSIS — M9905 Segmental and somatic dysfunction of pelvic region: Secondary | ICD-10-CM | POA: Diagnosis not present

## 2023-05-13 DIAGNOSIS — M9903 Segmental and somatic dysfunction of lumbar region: Secondary | ICD-10-CM | POA: Diagnosis not present

## 2023-06-03 DIAGNOSIS — M9905 Segmental and somatic dysfunction of pelvic region: Secondary | ICD-10-CM | POA: Diagnosis not present

## 2023-06-03 DIAGNOSIS — M9904 Segmental and somatic dysfunction of sacral region: Secondary | ICD-10-CM | POA: Diagnosis not present

## 2023-06-03 DIAGNOSIS — M9903 Segmental and somatic dysfunction of lumbar region: Secondary | ICD-10-CM | POA: Diagnosis not present

## 2023-06-03 DIAGNOSIS — M9902 Segmental and somatic dysfunction of thoracic region: Secondary | ICD-10-CM | POA: Diagnosis not present

## 2023-07-08 ENCOUNTER — Ambulatory Visit: Payer: BC Managed Care – PPO | Admitting: Family Medicine

## 2023-07-29 ENCOUNTER — Other Ambulatory Visit: Payer: Self-pay | Admitting: *Deleted

## 2023-07-29 MED ORDER — TIRZEPATIDE 15 MG/0.5ML ~~LOC~~ SOAJ: 15.0000 mg | SUBCUTANEOUS | 3 refills | Status: DC

## 2023-09-03 DIAGNOSIS — R7303 Prediabetes: Secondary | ICD-10-CM | POA: Diagnosis not present

## 2023-09-03 DIAGNOSIS — I723 Aneurysm of iliac artery: Secondary | ICD-10-CM | POA: Diagnosis not present

## 2023-09-03 DIAGNOSIS — E782 Mixed hyperlipidemia: Secondary | ICD-10-CM | POA: Diagnosis not present

## 2023-10-06 ENCOUNTER — Other Ambulatory Visit: Payer: Self-pay | Admitting: *Deleted

## 2023-10-06 MED ORDER — ROSUVASTATIN CALCIUM 40 MG PO TABS: 40.0000 mg | ORAL_TABLET | Freq: Every day | ORAL | 1 refills | Status: DC

## 2023-12-12 DIAGNOSIS — M9905 Segmental and somatic dysfunction of pelvic region: Secondary | ICD-10-CM | POA: Diagnosis not present

## 2023-12-12 DIAGNOSIS — M9903 Segmental and somatic dysfunction of lumbar region: Secondary | ICD-10-CM | POA: Diagnosis not present

## 2023-12-12 DIAGNOSIS — M9904 Segmental and somatic dysfunction of sacral region: Secondary | ICD-10-CM | POA: Diagnosis not present

## 2023-12-12 DIAGNOSIS — M9902 Segmental and somatic dysfunction of thoracic region: Secondary | ICD-10-CM | POA: Diagnosis not present

## 2023-12-15 DIAGNOSIS — M9904 Segmental and somatic dysfunction of sacral region: Secondary | ICD-10-CM | POA: Diagnosis not present

## 2023-12-15 DIAGNOSIS — M5441 Lumbago with sciatica, right side: Secondary | ICD-10-CM | POA: Diagnosis not present

## 2023-12-15 DIAGNOSIS — M9903 Segmental and somatic dysfunction of lumbar region: Secondary | ICD-10-CM | POA: Diagnosis not present

## 2023-12-15 DIAGNOSIS — M9902 Segmental and somatic dysfunction of thoracic region: Secondary | ICD-10-CM | POA: Diagnosis not present

## 2023-12-15 DIAGNOSIS — M9905 Segmental and somatic dysfunction of pelvic region: Secondary | ICD-10-CM | POA: Diagnosis not present

## 2023-12-18 DIAGNOSIS — M9905 Segmental and somatic dysfunction of pelvic region: Secondary | ICD-10-CM | POA: Diagnosis not present

## 2023-12-18 DIAGNOSIS — M9903 Segmental and somatic dysfunction of lumbar region: Secondary | ICD-10-CM | POA: Diagnosis not present

## 2023-12-18 DIAGNOSIS — M9904 Segmental and somatic dysfunction of sacral region: Secondary | ICD-10-CM | POA: Diagnosis not present

## 2023-12-18 DIAGNOSIS — M9902 Segmental and somatic dysfunction of thoracic region: Secondary | ICD-10-CM | POA: Diagnosis not present

## 2023-12-22 DIAGNOSIS — M9903 Segmental and somatic dysfunction of lumbar region: Secondary | ICD-10-CM | POA: Diagnosis not present

## 2023-12-22 DIAGNOSIS — M9904 Segmental and somatic dysfunction of sacral region: Secondary | ICD-10-CM | POA: Diagnosis not present

## 2023-12-22 DIAGNOSIS — M9905 Segmental and somatic dysfunction of pelvic region: Secondary | ICD-10-CM | POA: Diagnosis not present

## 2023-12-22 DIAGNOSIS — M9902 Segmental and somatic dysfunction of thoracic region: Secondary | ICD-10-CM | POA: Diagnosis not present

## 2023-12-25 DIAGNOSIS — M9903 Segmental and somatic dysfunction of lumbar region: Secondary | ICD-10-CM | POA: Diagnosis not present

## 2023-12-25 DIAGNOSIS — M9905 Segmental and somatic dysfunction of pelvic region: Secondary | ICD-10-CM | POA: Diagnosis not present

## 2023-12-25 DIAGNOSIS — M9904 Segmental and somatic dysfunction of sacral region: Secondary | ICD-10-CM | POA: Diagnosis not present

## 2023-12-25 DIAGNOSIS — M9902 Segmental and somatic dysfunction of thoracic region: Secondary | ICD-10-CM | POA: Diagnosis not present

## 2024-01-05 ENCOUNTER — Telehealth: Admitting: Physician Assistant

## 2024-01-05 ENCOUNTER — Ambulatory Visit: Payer: Self-pay

## 2024-01-05 DIAGNOSIS — J329 Chronic sinusitis, unspecified: Secondary | ICD-10-CM | POA: Diagnosis not present

## 2024-01-05 MED ORDER — FLUTICASONE PROPIONATE 50 MCG/ACT NA SUSP: 2.0000 | Freq: Every day | NASAL | 6 refills | Status: DC

## 2024-01-05 MED ORDER — AZITHROMYCIN 250 MG PO TABS: ORAL_TABLET | ORAL | 0 refills | Status: AC

## 2024-01-05 NOTE — Telephone Encounter (Signed)
 Appt today

## 2024-01-05 NOTE — Patient Instructions (Signed)
  Austin Hernandez, thank you for joining [name], PA-C for today's virtual visit.  While this provider is not your primary care provider (PCP), if your PCP is located in our provider database this encounter information will be shared with them immediately following your visit.   A [facility] MyChart account gives you access to today's visit and all your visits, tests, and labs performed at [facility]  click here if you don't have a [facility] MyChart account or go to [email or web address]  Consent: (Patient) Austin Hernandez provided verbal consent for this virtual visit at the beginning of the encounter.  Current Medications:  Current Outpatient Medications:    acetaminophen  (TYLENOL ) 650 MG CR tablet, Take 1,300 mg by mouth daily in the afternoon., Disp: , Rfl:    gabapentin  (NEURONTIN ) 300 MG capsule, TAKE 1 CAPSULE BY MOUTH EVERY MORNING AND TAKE 2 CAPSULES BY MOUTH AT NIGHT, Disp: 270 capsule, Rfl: 3   rosuvastatin  (CRESTOR ) 40 MG tablet, Take 1 tablet (40 mg total) by mouth daily., Disp: 90 tablet, Rfl: 1   tirzepatide  (MOUNJARO ) 15 MG/0.5ML Pen, Inject 15 mg into the skin once a week., Disp: 6 mL, Rfl: 3   valACYclovir  (VALTREX ) 500 MG tablet, TAKE 1 TABLET BY MOUTH TWICE A DAY, Disp: 180 tablet, Rfl: 1   Medications ordered in this encounter:  No orders of the defined types were placed in this encounter.    *If you need refills on other medications prior to your next appointment, please contact your pharmacy*  Follow-Up: Call back or seek an in-person evaluation if the symptoms worsen or if the condition fails to improve as anticipated.  [facility] [phone]  Other Instructions Please report to the nearest Emergency room with any worsening symptoms. Follow up with primary care provider (PCP) in 2 -3 days.    If you have been instructed to have an in-person evaluation today at a local Urgent Care facility, please use the link below. It  will take you to a list of all of our available [facility], including address, phone number and hours of operation. Please do not delay care.  [facility]  If you or a family member do not have a primary care provider, use the link below to schedule a visit and establish care. When you choose a [facility] primary care physician or advanced practice provider, you gain a long-term partner in health. Find a Primary Care Provider  Learn more about [facility]'s in-office and virtual care options: [facility] - Get Care Now

## 2024-01-05 NOTE — Progress Notes (Signed)
 Virtual Visit Consent   [name], you are scheduled for a virtual visit with a  provider today. Just as with appointments in the office, your consent must be obtained to participate. Your consent will be active for this visit and any virtual visit you may have with one of our providers in the next 365 days. If you have a MyChart account, a copy of this consent can be sent to you electronically.  As this is a virtual visit, video technology does not allow for your provider to perform a traditional examination. This may limit your provider's ability to fully assess your condition. If your provider identifies any concerns that need to be evaluated in person or the need to arrange testing (such as labs, EKG, etc.), we will make arrangements to do so. Although advances in technology are sophisticated, we cannot ensure that it will always work on either your end or our end. If the connection with a video visit is poor, the visit may have to be switched to a telephone visit. With either a video or telephone visit, we are not always able to ensure that we have a secure connection.  By engaging in this virtual visit, you consent to the provision of healthcare and authorize for your insurance to be billed (if applicable) for the services provided during this visit. Depending on your insurance coverage, you may receive a charge related to this service.  I need to obtain your verbal consent now. Are you willing to proceed with your visit today? [name] has provided verbal consent on 01/05/2024 for a virtual visit (video or telephone). Austin Hernandez, [place]  Date: 01/05/2024 1:49 PM   Virtual Visit via Video Note   I, Austin Hernandez, connected with  Mackey Curet  ([id number], 02/20/1966) on 01/05/24 at  1:45 PM EDT by a video-enabled telemedicine application and verified that I am speaking with the correct person using two identifiers.  Location: Patient: Virtual Visit Location Patient:  Other: Car Provider: Virtual Visit Location Provider: Home Office   I discussed the limitations of evaluation and management by telemedicine and the availability of in person appointments. The patient expressed understanding and agreed to proceed.    History of Present Illness: Austin Hernandez is a 58 y.o. who identifies as a male who was assigned male at birth, and is being seen today for sinus pain.  HPI: Sinus Problem This is a new problem. The current episode started in the past 7 days. The problem is unchanged. Associated symptoms include congestion and ear pain. Past treatments include acetaminophen . The treatment provided no relief.    Problems:  Patient Active Problem List   Diagnosis Date Noted   Morbid obesity (HCC) 04/02/2021   Iliac artery aneurysm, bilateral (HCC) 12/07/2020   Lumbar radiculopathy 12/07/2020   Recurrent low back pain 05/18/2020   OSA (obstructive sleep apnea) 12/11/2016   T2DM (type 2 diabetes mellitus) (HCC) 12/09/2016   History of cold sores 03/28/2008   Dyslipidemia associated with type 2 diabetes mellitus (HCC) 03/28/2008   Allergic rhinitis 03/28/2008    Allergies: No Known Allergies Medications:  Current Outpatient Medications:    acetaminophen  (TYLENOL ) 650 MG CR tablet, Take 1,300 mg by mouth daily in the afternoon., Disp: , Rfl:    gabapentin  (NEURONTIN ) 300 MG capsule, TAKE 1 CAPSULE BY MOUTH EVERY MORNING AND TAKE 2 CAPSULES BY MOUTH AT NIGHT, Disp: 270 capsule, Rfl: 3   rosuvastatin  (CRESTOR ) 40 MG tablet, Take 1 tablet (40 mg total) by mouth daily.,  Disp: 90 tablet, Rfl: 1   tirzepatide  (MOUNJARO ) 15 MG/0.5ML Pen, Inject 15 mg into the skin once a week., Disp: 6 mL, Rfl: 3   valACYclovir  (VALTREX ) 500 MG tablet, TAKE 1 TABLET BY MOUTH TWICE A DAY, Disp: 180 tablet, Rfl: 1  Observations/Objective: Patient is well-developed, well-nourished in no acute distress.  Resting comfortably  at home.  Head is normocephalic, atraumatic.  No labored  breathing.  Speech is clear and coherent with logical content.  Patient is alert and oriented at baseline.    Assessment and Plan: 1. Sinusitis, unspecified chronicity, unspecified location (Primary)  Presentation consistent with sinusitis.  No evidence of other bacterial infections including pneumonia, meningitis, pharyngitis, otitis media.  Discussed that this fits picture of viral vs bacterial sinusitis and that due to type and duration of symptoms and exam findings, we will treat as bacterial sinusitis.  Antibiotics prescribed. Advised to continue ibuprofen and Tylenol  at home. Patient is to follow up with primary physician if having continued symptoms.   Advised patient on supportive therapies, including using a cool-mist vaporizer/humidifier/steam from hot showers, OTC throat lozenges, advancement of fluids as tolerated, nasal saline sprays, rest, OTC acetaminophen  or ibuprofen for pain control, frequent handwashing.  Follow Up Instructions: I discussed the assessment and treatment plan with the patient. The patient was provided an opportunity to ask questions and all were answered. The patient agreed with the plan and demonstrated an understanding of the instructions.  A copy of instructions were sent to the patient via MyChart unless otherwise noted below.    The patient was advised to call back or seek an in-person evaluation if the symptoms worsen or if the condition fails to improve as anticipated.    Austin Shuck, PA-C

## 2024-01-05 NOTE — Telephone Encounter (Signed)
 First Attempt: LVM for patient to return call to [phone]   Copied from CRM [id number]. Topic: Clinical - Pink Word Triage >> Jan 05, 2024  8:30 AM Mesmerise C wrote: Reason for Triage: Patient stated he's experiencing a sinus infection leaved for [place] tomorrow and would like a medication to be prescribed and sent over beofre he leaves

## 2024-01-05 NOTE — Telephone Encounter (Signed)
 FYI Only or Action Required?: FYI only for provider.  Patient was last seen in primary care on 02/25/2023 by Kennyth Worth HERO, MD. Called Nurse Triage reporting Sinusitis. Symptoms began several days ago. Interventions attempted: OTC medications: afrin nasal spray. Symptoms are: gradually worsening.  Triage Disposition: Home Care  Patient/caregiver understands and will follow disposition?: Yes  Reason for Disposition  [1] Sinus congestion as part of a cold AND [2] present < 10 days  Answer Assessment - Initial Assessment Questions 1. LOCATION: Where does it hurt?      Left nose and sinus cavities under eyes 2. ONSET: When did the sinus pain start?  (e.g., hours, days)      Two days ago  4. RECURRENT SYMPTOM: Have you ever had sinus problems before? If Yes, ask: When was the last time? and What happened that time?      Has had problems from time to time 5. NASAL CONGESTION: Is the nose blocked? If Yes, ask: Can you open it or must you breathe through your mouth?     Nose is stopped up  7. FEVER: Do you have a fever? If Yes, ask: What is it, how was it measured, and when did it start?      denies 8. OTHER SYMPTOMS: Do you have any other symptoms? (e.g., sore throat, cough, earache, difficulty breathing)     denies  Protocols used: Sinus Pain or Congestion-A-AH

## 2024-02-10 ENCOUNTER — Other Ambulatory Visit: Payer: Self-pay | Admitting: Family Medicine

## 2024-02-10 NOTE — Telephone Encounter (Signed)
 Copied from CRM [id number]. Topic: Clinical - Medication Refill >> Feb 10, 2024  3:45 PM [place] J wrote: Medication: tirzepatide  (MOUNJARO ) 15 MG/0.5ML Pen  Has the patient contacted their pharmacy? No (Agent: If no, request that the patient contact the pharmacy for the refill. If patient does not wish to contact the pharmacy document the reason why and proceed with request.) (Agent: If yes, when and what did the pharmacy advise?) Patient didn't think calling the pharmacy would help since last time he tried to request a refill through them, they redirected him back to primary care.  This is the patient's preferred pharmacy:  [id number] [place], [place] - [address] [place] [address] Phone: [phone] Fax: [phone]  Is this the correct pharmacy for this prescription? Yes If no, delete pharmacy and type the correct one.   Has the prescription been filled recently? No  Is the patient out of the medication? Yes  Has the patient been seen for an appointment in the last year OR does the patient have an upcoming appointment? Yes  Can we respond through MyChart? Yes  Agent: Please be advised that Rx refills may take up to 3 business days. We ask that you follow-up with your pharmacy.

## 2024-02-11 MED ORDER — TIRZEPATIDE 15 MG/0.5ML ~~LOC~~ SOAJ: 15.0000 mg | SUBCUTANEOUS | 3 refills | Status: AC

## 2024-03-18 ENCOUNTER — Other Ambulatory Visit: Payer: Self-pay | Admitting: Family Medicine

## 2024-04-14 ENCOUNTER — Other Ambulatory Visit: Payer: Self-pay | Admitting: Family Medicine

## 2024-05-03 DIAGNOSIS — M9905 Segmental and somatic dysfunction of pelvic region: Secondary | ICD-10-CM | POA: Diagnosis not present

## 2024-05-03 DIAGNOSIS — M9902 Segmental and somatic dysfunction of thoracic region: Secondary | ICD-10-CM | POA: Diagnosis not present

## 2024-05-03 DIAGNOSIS — M9904 Segmental and somatic dysfunction of sacral region: Secondary | ICD-10-CM | POA: Diagnosis not present

## 2024-05-03 DIAGNOSIS — M9903 Segmental and somatic dysfunction of lumbar region: Secondary | ICD-10-CM | POA: Diagnosis not present

## 2024-05-04 ENCOUNTER — Ambulatory Visit: Payer: Self-pay

## 2024-05-04 ENCOUNTER — Encounter: Payer: Self-pay | Admitting: Physician Assistant

## 2024-05-04 ENCOUNTER — Ambulatory Visit (INDEPENDENT_AMBULATORY_CARE_PROVIDER_SITE_OTHER): Admitting: Physician Assistant

## 2024-05-04 VITALS — BP 180/102 | HR 82 | Temp 97.9°F | Ht 73.0 in | Wt 313.5 lb

## 2024-05-04 DIAGNOSIS — Z23 Encounter for immunization: Secondary | ICD-10-CM | POA: Diagnosis not present

## 2024-05-04 DIAGNOSIS — Z7985 Long-term (current) use of injectable non-insulin antidiabetic drugs: Secondary | ICD-10-CM

## 2024-05-04 DIAGNOSIS — G4733 Obstructive sleep apnea (adult) (pediatric): Secondary | ICD-10-CM

## 2024-05-04 DIAGNOSIS — E1169 Type 2 diabetes mellitus with other specified complication: Secondary | ICD-10-CM | POA: Diagnosis not present

## 2024-05-04 DIAGNOSIS — E1165 Type 2 diabetes mellitus with hyperglycemia: Secondary | ICD-10-CM

## 2024-05-04 DIAGNOSIS — I10 Essential (primary) hypertension: Secondary | ICD-10-CM | POA: Diagnosis not present

## 2024-05-04 DIAGNOSIS — M5416 Radiculopathy, lumbar region: Secondary | ICD-10-CM

## 2024-05-04 DIAGNOSIS — E785 Hyperlipidemia, unspecified: Secondary | ICD-10-CM | POA: Diagnosis not present

## 2024-05-04 LAB — CBC WITH DIFFERENTIAL/PLATELET
Basophils Absolute: 0 K/uL (ref 0.0–0.1)
Basophils Relative: 0.6 % (ref 0.0–3.0)
Eosinophils Absolute: 0 K/uL (ref 0.0–0.7)
Eosinophils Relative: 0.6 % (ref 0.0–5.0)
HCT: 47.4 % (ref 39.0–52.0)
Hemoglobin: 16.3 g/dL (ref 13.0–17.0)
Lymphocytes Relative: 30.2 % (ref 12.0–46.0)
Lymphs Abs: 2 K/uL (ref 0.7–4.0)
MCHC: 34.5 g/dL (ref 30.0–36.0)
MCV: 91.7 fl (ref 78.0–100.0)
Monocytes Absolute: 0.6 K/uL (ref 0.1–1.0)
Monocytes Relative: 9.6 % (ref 3.0–12.0)
Neutro Abs: 4 K/uL (ref 1.4–7.7)
Neutrophils Relative %: 59 % (ref 43.0–77.0)
Platelets: 191 K/uL (ref 150.0–400.0)
RBC: 5.17 Mil/uL (ref 4.22–5.81)
RDW: 13.9 % (ref 11.5–15.5)
WBC: 6.8 K/uL (ref 4.0–10.5)

## 2024-05-04 LAB — COMPREHENSIVE METABOLIC PANEL WITH GFR
ALT: 75 U/L — ABNORMAL HIGH (ref 0–53)
AST: 50 U/L — ABNORMAL HIGH (ref 0–37)
Albumin: 4.4 g/dL (ref 3.5–5.2)
Alkaline Phosphatase: 67 U/L (ref 39–117)
BUN: 16 mg/dL (ref 6–23)
CO2: 27 meq/L (ref 19–32)
Calcium: 9.2 mg/dL (ref 8.4–10.5)
Chloride: 102 meq/L (ref 96–112)
Creatinine, Ser: 1 mg/dL (ref 0.40–1.50)
GFR: 81.95 mL/min (ref >60.00)
Glucose, Bld: 84 mg/dL (ref 70–99)
Potassium: 3.7 meq/L (ref 3.5–5.1)
Sodium: 139 meq/L (ref 135–145)
Total Bilirubin: 0.6 mg/dL (ref 0.2–1.2)
Total Protein: 7 g/dL (ref 6.0–8.3)

## 2024-05-04 LAB — LIPID PANEL
Cholesterol: 301 mg/dL — ABNORMAL HIGH (ref 0–200)
HDL: 46.7 mg/dL (ref >39.00)
LDL Cholesterol: 210 mg/dL — ABNORMAL HIGH (ref 0–99)
NonHDL: 254.64
Total CHOL/HDL Ratio: 6
Triglycerides: 221 mg/dL — ABNORMAL HIGH (ref 0.0–149.0)
VLDL: 44.2 mg/dL — ABNORMAL HIGH (ref 0.0–40.0)

## 2024-05-04 LAB — HEMOGLOBIN A1C: Hgb A1c MFr Bld: 6.5 % (ref 4.6–6.5)

## 2024-05-04 MED ORDER — LOSARTAN POTASSIUM 25 MG PO TABS: ORAL_TABLET | ORAL | 0 refills | Status: DC

## 2024-05-04 NOTE — Telephone Encounter (Signed)
 Appt today

## 2024-05-04 NOTE — Patient Instructions (Addendum)
 VISIT SUMMARY: You visited today to address your elevated blood pressure. We discussed your hypertension, diabetes, neuropathy, high cholesterol, and sleep apnea. We have started a new medication for your blood pressure and made some recommendations for lifestyle changes.  YOUR PLAN: HYPERTENSION: You have been newly diagnosed with high blood pressure, which is likely due to multiple factors including stress, high salt intake, and untreated sleep apnea. -Start taking losartan as prescribed to manage your blood pressure. Take 25 mg daily x 2 weeks. May increase to 50 mg daily after that if BP remains above goal. -Follow up with Doctor Kennyth in 2-4 weeks to review your blood work and blood pressure. -Reduce your salt intake, increase your exercise, and manage your stress levels.  Your goal blood pressure should be around < 130/80, unless you are over 27 years old, your goal may be closer to 140-150/90. Please note if you have been given other goals from a cardiologist or other healthcare provider, please defer to their recommendations.  When preparing to take your blood pressure: Plan ahead. Don't smoke, drink caffeine or exercise within 30 minutes before taking your blood pressure. Empty your bladder. Don't take the measurement over clothes. Remove the clothing over the arm that will be used to measure blood pressure. You can use either arm unless otherwise told by a healthcare provider. Usually there is not a big difference between readings on them. Be still. Allow at least five minutes of quiet rest before measurements. Don't talk or use the phone. Sit correctly. Sit with your back straight and supported (on a dining chair, rather than a sofa). Your feet should be flat on the floor. Do not cross your legs. Support your arm on a flat surface. The middle of the cuff should be placed on the upper arm at heart level.  Measure at the same time of the day. Take multiple readings and record the results.  Each time you measure, take two readings one minute apart. Record the results and bring in to your next office visit.  In order to know how well the medication is working, I would like you to take your readings 1-2 hours after taking your blood pressure medication if possible. Take your blood pressure measurements and record 2-3 days per week.  If you get a high blood pressure reading: A single high reading is not an immediate cause for alarm. If you get a reading that is higher than normal, take your blood pressure a second time. Write down the results of both measurements. Check with your health care professional to see if there's a health concern or whether there may be problems with your monitor. If your blood pressure readings are suddenly higher than 180/120 mm Hg, wait at least one minute and test again. If your readings are still very high, contact your health care professional immediately. You could be having a hypertensive crisis. Call 911 if your blood pressure is higher than 180/120 mm Hg and if you are having new signs or symptoms that may include: Chest pain Shortness of breath Back pain Numbness Weakness Change in vision Difficulty speaking Confusion Dizziness Vomiting                           Contains text generated by Abridge.                                 Contains  text generated by Abridge.

## 2024-05-04 NOTE — Progress Notes (Signed)
 Austin Hernandez is a 58 y.o. male here for a follow up of a pre-existing problem.  History of Present Illness:   Chief Complaint  Patient presents with   c/o Elevated blood pressure    Patient has been experiencing high blood pressure this morning the reading was 151/98, last night 164/103, he was seen at a specialist office yesterday and they got a reading above 200 but does not remember the exact number. Denies chest pain, no headaches, asymptomatic.    Discussed the use of AI scribe software for clinical note transcription with the patient, who gave verbal consent to proceed.  History of Present Illness   Austin Hernandez is a 58 year old male with hypertension and diabetes who presents with elevated blood pressure.  Recent blood pressure readings are elevated, with a morning reading of 151/98 mmHg and an evening reading of 164/103 mmHg. He has not required treatment for hypertension previously. There is no chest pain, shortness of breath, blurred vision, or headaches.  Diabetes is managed with Mounjaro  15 mg weekly, switched from Ozempic . Crestor  was discontinued about a month and a half ago due to neuropathy concerns, with no change in symptoms. High cholesterol has been managed with statins since his late twenties, initially with Lipitor and more recently with Crestor .  Neuropathy affects balance, with reduced sensation in both feet. He is getting treatment(s) from a place in [place] -- home treatment includes electrode therapy and infrared treatment. He drinks a green supplement twice daily.  He has sleep apnea but does not use a CPAP machine due to discomfort and travel. A mouth guard is available but not used recently. Stress from work and travel may contribute to elevated blood pressure.  He consumes one cup of coffee daily, occasionally more, and enjoys salty foods. Alcohol intake is infrequent. He does not engage in regular exercise beyond walking his dog. He quit smoking in  2011 and does not vape. Frequent work travel contributes to stress and impacts CPAP use.        Past Medical History:  Diagnosis Date   Back pain    GERD (gastroesophageal reflux disease)    History of chicken pox    HLD (hyperlipidemia)    Joint pain    OSA (obstructive sleep apnea) 12/11/2016   Restless leg    Seasonal allergies    Sleep apnea      Social History   Tobacco Use   Smoking status: Former    Current packs/day: 0.00    Average packs/day: 1 pack/day for 25.0 years (25.0 ttl pk-yrs)    Types: Cigarettes    Start date: 07/01/1985    Quit date: 07/01/2010    Years since quitting: 13.8   Smokeless tobacco: Never  Vaping Use   Vaping status: Never Used  Substance Use Topics   Alcohol use: Yes    Alcohol/week: 5.0 - 6.0 standard drinks of alcohol    Types: 5 - 6 Glasses of wine per week    Comment: Occasional (weekends)   Drug use: No    Past Surgical History:  Procedure Laterality Date   ADENOIDECTOMY  1970   ANKLE SURGERY  1980s   torn ligaments; bilateral; separate surgeries (4 total), sports   UMBILICAL HERNIA REPAIR      Family History  Problem Relation Age of Onset   Arthritis Mother    Hypertension Mother    Hyperlipidemia Mother    Squamous cell carcinoma Father    Cancer Father 45  throat cancer (squamous), smoker   Stroke Maternal Grandmother 50   Diabetes Maternal Grandmother    Cancer Paternal Grandfather        lung?   Coronary artery disease Neg Hx     No Known Allergies  Current Medications:   Current Outpatient Medications:    fluticasone  (FLONASE ) 50 MCG/ACT nasal spray, Place 2 sprays into both nostrils daily., Disp: 16 g, Rfl: 6   losartan (COZAAR) 25 MG tablet, Take 25 mg daily x 2 weeks. After two weeks, may increase to 50 mg daily., Disp: 60 tablet, Rfl: 0   tirzepatide  (MOUNJARO ) 15 MG/0.5ML Pen, Inject 15 mg into the skin once a week., Disp: 6 mL, Rfl: 3   valACYclovir  (VALTREX ) 500 MG tablet, TAKE 1 TABLET BY MOUTH  TWICE A DAY, Disp: 60 tablet, Rfl: 0   rosuvastatin  (CRESTOR ) 40 MG tablet, TAKE 1 TABLET BY MOUTH EVERY DAY (Patient not taking: Reported on 05/04/2024), Disp: 30 tablet, Rfl: 0   Review of Systems:   Negative unless otherwise specified per HPI.  Vitals:   Vitals:   05/04/24 1347 05/04/24 1424  BP: (!) 180/110 (!) 180/102  Pulse: 82   Temp: 97.9 F (36.6 C)   TempSrc: Temporal   SpO2: 97%   Weight: (!) 313 lb 8 oz (142.2 kg)   Height: 6' 1 (1.854 m)      Body mass index is 41.36 kg/m.  Physical Exam:   Physical Exam Vitals and nursing note reviewed.  Constitutional:      General: He is not in acute distress.    Appearance: He is well-developed. He is not ill-appearing or toxic-appearing.  Cardiovascular:     Rate and Rhythm: Normal rate and regular rhythm.     Pulses: Normal pulses.     Heart sounds: Normal heart sounds, S1 normal and S2 normal.  Pulmonary:     Effort: Pulmonary effort is normal.     Breath sounds: Normal breath sounds.  Skin:    General: Skin is warm and dry.  Neurological:     Mental Status: He is alert.     GCS: GCS eye subscore is 4. GCS verbal subscore is 5. GCS motor subscore is 6.  Psychiatric:        Speech: Speech normal.        Behavior: Behavior normal. Behavior is cooperative.     Assessment and Plan:   Assessment and Plan    Hypertension Newly diagnosed with consistently elevated readings. Multifactorial etiology including stress, high salt intake, and untreated sleep apnea. Asymptomatic -- reports there is no chest pain, shortness of breath, lower extremity swelling. - Start losartan 25 mg daily for blood pressure management. May increase to 50 mg daily after two weeks if indicated. - Scheduled follow-up with Dr. Kennyth in 4 weeks to review blood work and blood pressure. - Advised on lifestyle modifications: reduce salt intake, increase exercise, manage stress, use of OSA devices. - If chest pain, shortness of breath, severe  headache(s) -- needs to go to the ER for evaluation as soon as possible   Type 2 diabetes mellitus Managed with Mounjaro  since December. Blood pressure management is crucial due to increased risk of stroke and heart attack in diabetics. - Continue Mounjaro  for diabetes management. - Ordered blood work to assess current status. - Needs close follow up with Primary Care Provider (PCP)   Lumbar radiculopathy  Chronic condition with reduced sensation in feet. No change in symptoms after stopping Crestor . - Resume Crestor   as there was no change in symptoms after discontinuation.  Hyperlipidemia Previously managed with Crestor . No adverse effects noted from statin use. Resuming Crestor  is recommended due to lack of alternative treatments and previous good control of cholesterol levels. - Resume Crestor  for hyperlipidemia management. - Defer to Primary Care Provider (PCP) for changes.  Obstructive sleep apnea Non-compliance to CPAP due to discomfort and travel constraints. Untreated sleep apnea may contribute to hypertension. - Encouraged consistent use of mouth guard. - Discussed potential benefits of CPAP compliance.      Lucie Buttner, PA-C

## 2024-05-04 NOTE — Telephone Encounter (Signed)
 FYI Only or Action Required?: FYI only for provider: appointment scheduled on 11/4.  Patient was last seen in primary care on 01/05/2024 by Rolan Berthold, PA-C.  Called Nurse Triage reporting Hypertension.  Symptoms began yesterday.  Interventions attempted: Nothing.  Symptoms are: gradually worsening.  Triage Disposition: See Physician Within 24 Hours  Patient/caregiver understands and will follow disposition?: Yes FYI: Pt states that he stopped taking his Crestor  2 months ago. Has no history of HTN but BP has been under a lot of stress. Appt scheduled today with [facility]  Copied from CRM [id number]. Topic: Clinical - Red Word Triage >> May 04, 2024  9:22 AM Franky GRADE wrote: Red Word that prompted transfer to Nurse Triage: patient has been experiencing high blood pressure this morning the reading was 151/98, last night 164/103, he was seen at a specialist office and they got a reading above 200 but does not remember the exact number, he is not expereicning symptoms but is concerned due to the blood pressure not going down. Reason for Disposition  Systolic BP >= 180 OR Diastolic >= 110  Answer Assessment - Initial Assessment Questions 1. BLOOD PRESSURE: What is your blood pressure? Did you take at least two measurements 5 minutes apart?     This morning at Chiropractor office BP 200/140 then 190/105     Last night BP was 151/98 HR 105; 164/103 HR 81;   2. ONSET: When did you take your blood pressure?     Taken at chiropractice office this morning  3. HOW: How did you take your blood pressure? (e.g., automatic home BP monitor, visiting nurse)     Taken at doctor appt  4. HISTORY: Do you have a history of high blood pressure?     Has no history of HTB  5. MEDICINES: Are you taking any medicines for blood pressure? Have you missed any doses recently?     Does not take BP meds  6. OTHER SYMPTOMS: Do you have any symptoms? (e.g., blurred vision, chest pain, difficulty  breathing, headache, weakness)     No  Protocols used: Blood Pressure - High-A-AH

## 2024-05-05 DIAGNOSIS — M9903 Segmental and somatic dysfunction of lumbar region: Secondary | ICD-10-CM | POA: Diagnosis not present

## 2024-05-05 DIAGNOSIS — M9905 Segmental and somatic dysfunction of pelvic region: Secondary | ICD-10-CM | POA: Diagnosis not present

## 2024-05-05 DIAGNOSIS — M9904 Segmental and somatic dysfunction of sacral region: Secondary | ICD-10-CM | POA: Diagnosis not present

## 2024-05-05 DIAGNOSIS — M9902 Segmental and somatic dysfunction of thoracic region: Secondary | ICD-10-CM | POA: Diagnosis not present

## 2024-05-05 LAB — MICROALBUMIN / CREATININE URINE RATIO
Creatinine,U: 122.3 mg/dL
Microalb Creat Ratio: 10.7 mg/g (ref 0.0–30.0)
Microalb, Ur: 1.3 mg/dL (ref 0.0–1.9)

## 2024-05-05 LAB — TSH: TSH: 3.25 u[IU]/mL (ref 0.35–5.50)

## 2024-05-06 ENCOUNTER — Ambulatory Visit: Payer: Self-pay | Admitting: Physician Assistant

## 2024-05-09 ENCOUNTER — Other Ambulatory Visit: Payer: Self-pay | Admitting: Family Medicine

## 2024-05-19 DIAGNOSIS — I708 Atherosclerosis of other arteries: Secondary | ICD-10-CM | POA: Diagnosis not present

## 2024-05-19 DIAGNOSIS — I718 Aortic aneurysm of unspecified site, ruptured: Secondary | ICD-10-CM | POA: Diagnosis not present

## 2024-05-19 DIAGNOSIS — K760 Fatty (change of) liver, not elsewhere classified: Secondary | ICD-10-CM | POA: Diagnosis not present

## 2024-05-26 ENCOUNTER — Other Ambulatory Visit: Payer: Self-pay | Admitting: Physician Assistant

## 2024-06-03 ENCOUNTER — Ambulatory Visit: Admitting: Family Medicine

## 2024-06-03 ENCOUNTER — Encounter: Payer: Self-pay | Admitting: Family Medicine

## 2024-06-03 VITALS — BP 138/78 | HR 85 | Temp 98.2°F | Ht 73.0 in | Wt 320.0 lb

## 2024-06-03 DIAGNOSIS — E1349 Other specified diabetes mellitus with other diabetic neurological complication: Secondary | ICD-10-CM

## 2024-06-03 DIAGNOSIS — E1169 Type 2 diabetes mellitus with other specified complication: Secondary | ICD-10-CM | POA: Diagnosis not present

## 2024-06-03 DIAGNOSIS — E1140 Type 2 diabetes mellitus with diabetic neuropathy, unspecified: Secondary | ICD-10-CM | POA: Insufficient documentation

## 2024-06-03 DIAGNOSIS — E1159 Type 2 diabetes mellitus with other circulatory complications: Secondary | ICD-10-CM | POA: Diagnosis not present

## 2024-06-03 DIAGNOSIS — E1165 Type 2 diabetes mellitus with hyperglycemia: Secondary | ICD-10-CM

## 2024-06-03 DIAGNOSIS — I152 Hypertension secondary to endocrine disorders: Secondary | ICD-10-CM | POA: Insufficient documentation

## 2024-06-03 DIAGNOSIS — Z7985 Long-term (current) use of injectable non-insulin antidiabetic drugs: Secondary | ICD-10-CM | POA: Diagnosis not present

## 2024-06-03 NOTE — Assessment & Plan Note (Addendum)
 Last A1c well-controlled 6.5 on Mounjaro  15 mg weekly.

## 2024-06-03 NOTE — Assessment & Plan Note (Signed)
 He is working with a group for restoring sensation in lower extremities out of [place].  Discussed with patient I am not familiar with this modality.  We had previously had him on gabapentin  however did not find this as effective does not wish to restart at this point.

## 2024-06-03 NOTE — Assessment & Plan Note (Signed)
 Patient has been off the crestor  for the last 2 months.  Most recent lipid panel showed LDL 210.  He would like to avoid statins if possible but is interested in exploring alternatives.  Will place referral to advanced lipid clinic to discuss alternative treatment strategies.

## 2024-06-03 NOTE — Assessment & Plan Note (Signed)
 Blood pressure at goal today.  He will monitor at home and let us  know if persistently elevated.  Continue his losartan  50 mg daily.

## 2024-06-03 NOTE — Patient Instructions (Signed)
 It was very nice to see you today!  VISIT SUMMARY: You came in for a follow-up on your blood pressure management and neuropathy treatment. We discussed your current medications and treatments, and made some adjustments to better manage your conditions.  YOUR PLAN: TYPE 2 DIABETES MELLITUS WITH PERIPHERAL NEUROPATHY AND HYPERGLYCEMIA: You have significant sensory loss due to peripheral neuropathy, and Gabapentin  has not been effective. Your diabetes is managed with Mounjaro . -Continue your current home treatment for neuropathy. -Continue using Mounjaro  for diabetes management.  HYPERTENSION: Your blood pressure readings at home have been elevated. -Continue taking losartan  50 mg twice daily. -Monitor your blood pressure at home every second day. -Send a MyChart message with your readings in a few weeks.  HYPERLIPIDEMIA: Your cholesterol levels are elevated, and you have stopped taking Crestor  due to side effects. -We discussed Repatha as an alternative, pending insurance coverage. -You are referred to a cholesterol specialist for evaluation and potential initiation of Repatha.  No follow-ups on file.   Take care, Dr Kennyth  PLEASE NOTE:  If you had any lab tests, please let us  know if you have not heard back within a few days. You may see your results on mychart before we have a chance to review them but we will give you a call once they are reviewed by us .   If we ordered any referrals today, please let us  know if you have not heard from their office within the next week.   If you had any urgent prescriptions sent in today, please check with the pharmacy within an hour of our visit to make sure the prescription was transmitted appropriately.   Please try these tips to maintain a healthy lifestyle:  Eat at least 3 REAL meals and 1-2 snacks per day.  Aim for no more than 5 hours between eating.  If you eat breakfast, please do so within one hour of getting up.   Each meal should  contain half fruits/vegetables, one quarter protein, and one quarter carbs (no bigger than a computer mouse)  Cut down on sweet beverages. This includes juice, soda, and sweet tea.   Drink at least 1 glass of water with each meal and aim for at least 8 glasses per day  Exercise at least 150 minutes every week.

## 2024-06-03 NOTE — Progress Notes (Signed)
 Austin Hernandez is a 58 y.o. male who presents today for an office visit.  Assessment/Plan:   Chronic Problems Addressed Today: Dyslipidemia associated with type 2 diabetes mellitus (HCC) Patient has been off the crestor  for the last 2 months.  Most recent lipid panel showed LDL 210.  He would like to avoid statins if possible but is interested in exploring alternatives.  Will place referral to advanced lipid clinic to discuss alternative treatment strategies.  Type 2 diabetes mellitus with hyperglycemia (HCC) Last A1c well-controlled 6.5 on Mounjaro  15 mg weekly.  Diabetic neuropathy (HCC) He is working with a group for restoring sensation in lower extremities out of [place].  Discussed with patient I am not familiar with this modality.  We had previously had him on gabapentin  however did not find this as effective does not wish to restart at this point.  Hypertension associated with diabetes (HCC) Blood pressure at goal today.  He will monitor at home and let us  know if persistently elevated.  Continue his losartan  50 mg daily.     Subjective:  HPI:  See assessment / plan for status of chronic conditions.   Discussed the use of AI scribe software for clinical note transcription with the patient, who gave verbal consent to proceed.  History of Present Illness Austin Hernandez is a 58 year old male with hypertension and neuropathy who presents for follow-up on blood pressure management and neuropathy treatment.  He has been managing hypertension with losartan , initially taking it once daily for two weeks and then increasing to twice daily for the past one to two weeks. His blood pressure was very high during his last visit, and recent measurements have shown readings around 160/100 mmHg and 150/90 mmHg, with occasional lower readings. He has not made any lifestyle changes and notes increased work pressure and travel.  He continues to experience neuropathy, primarily in his  feet, with a 55% loss of feeling in the right foot and 12% in the left foot. He is undergoing home treatments including infrared therapy and shock stimulation in water, which he finds difficult to perform consistently due to his travel schedule. He also uses a health drink as part of a treatment plan he started in June, which was recommended after a study by a group called [employer]. Walking is challenging due to weakness and fatigue, especially during travel, and he feels like he is 'walking on one leg'.  He has a history of using gabapentin , which provided some relief for sciatic issues but did not significantly impact his neuropathy symptoms. He has stopped taking gabapentin  regularly but uses it occasionally if symptoms worsen.  He has a history of elevated cholesterol and was previously on Crestor , which he stopped for about six months due to concerns about statins potentially contributing to neuropathy. His cholesterol levels increased significantly during this period, prompting him to resume Crestor . He is interested in exploring other cholesterol-lowering medications.  His diabetes management is stable with Mounjaro , and he has been using it consistently.         Objective:  Physical Exam: BP 138/78   Pulse 85   Temp 98.2 F (36.8 C) (Temporal)   Ht 6' 1 (1.854 m)   Wt (!) 320 lb (145.2 kg)   SpO2 98%   BMI 42.22 kg/m   Gen: No acute distress, resting comfortably CV: Regular rate and rhythm with no murmurs appreciated Pulm: Normal work of breathing, clear to auscultation bilaterally with no crackles, wheezes, or rhonchi Neuro: Grossly  normal, moves all extremities Psych: Normal affect and thought content      Malikhi Ogan M. Kennyth, MD 06/03/2024 2:45 PM
